# Patient Record
Sex: Female | Born: 1980
Health system: Southern US, Community
[De-identification: ages and names within clinical notes are randomized; demographics above are authoritative.]

## PROBLEM LIST (undated history)

## (undated) DIAGNOSIS — K219 Gastro-esophageal reflux disease without esophagitis: Secondary | ICD-10-CM

## (undated) DIAGNOSIS — Z9049 Acquired absence of other specified parts of digestive tract: Secondary | ICD-10-CM

## (undated) DIAGNOSIS — E039 Hypothyroidism, unspecified: Secondary | ICD-10-CM

## (undated) DIAGNOSIS — D649 Anemia, unspecified: Secondary | ICD-10-CM

## (undated) DIAGNOSIS — N926 Irregular menstruation, unspecified: Secondary | ICD-10-CM

## (undated) DIAGNOSIS — R002 Palpitations: Secondary | ICD-10-CM

## (undated) HISTORY — DX: Gastro-esophageal reflux disease without esophagitis: K21.9

## (undated) HISTORY — PX: MYRINGOTOMY WITH TUBE PLACEMENT: SHX5663

## (undated) HISTORY — DX: Hypothyroidism, unspecified: E03.9

## (undated) HISTORY — DX: Acquired absence of other specified parts of digestive tract: Z90.49

## (undated) HISTORY — DX: Irregular menstruation, unspecified: N92.6

## (undated) HISTORY — PX: WISDOM TOOTH EXTRACTION: SHX21

## (undated) HISTORY — DX: Palpitations: R00.2

## (undated) HISTORY — PX: CHOLECYSTECTOMY: SHX55

---

## 2000-05-23 ENCOUNTER — Inpatient Hospital Stay (HOSPITAL_COMMUNITY): Admission: AD | Admit: 2000-05-23 | Discharge: 2000-05-23 | Payer: Self-pay | Admitting: *Deleted

## 2000-05-23 ENCOUNTER — Encounter: Payer: Self-pay | Admitting: *Deleted

## 2000-08-14 ENCOUNTER — Encounter: Payer: Self-pay | Admitting: Obstetrics and Gynecology

## 2000-08-14 ENCOUNTER — Ambulatory Visit (HOSPITAL_COMMUNITY): Admission: RE | Admit: 2000-08-14 | Discharge: 2000-08-14 | Payer: Self-pay | Admitting: Obstetrics and Gynecology

## 2000-10-04 ENCOUNTER — Inpatient Hospital Stay (HOSPITAL_COMMUNITY): Admission: AD | Admit: 2000-10-04 | Discharge: 2000-10-04 | Payer: Self-pay | Admitting: Obstetrics and Gynecology

## 2000-10-22 ENCOUNTER — Inpatient Hospital Stay (HOSPITAL_COMMUNITY): Admission: AD | Admit: 2000-10-22 | Discharge: 2000-10-22 | Payer: Self-pay | Admitting: Obstetrics and Gynecology

## 2000-10-23 ENCOUNTER — Inpatient Hospital Stay (HOSPITAL_COMMUNITY): Admission: AD | Admit: 2000-10-23 | Discharge: 2000-10-23 | Payer: Self-pay | Admitting: *Deleted

## 2000-11-19 ENCOUNTER — Encounter: Payer: Self-pay | Admitting: Obstetrics and Gynecology

## 2000-11-19 ENCOUNTER — Inpatient Hospital Stay (HOSPITAL_COMMUNITY): Admission: AD | Admit: 2000-11-19 | Discharge: 2000-11-19 | Payer: Self-pay | Admitting: Obstetrics and Gynecology

## 2000-11-20 ENCOUNTER — Inpatient Hospital Stay (HOSPITAL_COMMUNITY): Admission: AD | Admit: 2000-11-20 | Discharge: 2000-11-20 | Payer: Self-pay | Admitting: Obstetrics and Gynecology

## 2000-12-02 ENCOUNTER — Inpatient Hospital Stay (HOSPITAL_COMMUNITY): Admission: AD | Admit: 2000-12-02 | Discharge: 2000-12-04 | Payer: Self-pay | Admitting: Obstetrics and Gynecology

## 2001-01-08 ENCOUNTER — Other Ambulatory Visit: Admission: RE | Admit: 2001-01-08 | Discharge: 2001-01-08 | Payer: Self-pay | Admitting: Obstetrics and Gynecology

## 2002-02-04 ENCOUNTER — Other Ambulatory Visit: Admission: RE | Admit: 2002-02-04 | Discharge: 2002-02-04 | Payer: Self-pay | Admitting: Obstetrics and Gynecology

## 2002-10-08 ENCOUNTER — Other Ambulatory Visit: Admission: RE | Admit: 2002-10-08 | Discharge: 2002-10-08 | Payer: Self-pay | Admitting: Obstetrics and Gynecology

## 2003-03-03 ENCOUNTER — Inpatient Hospital Stay (HOSPITAL_COMMUNITY): Admission: AD | Admit: 2003-03-03 | Discharge: 2003-03-03 | Payer: Self-pay | Admitting: Obstetrics and Gynecology

## 2003-03-25 ENCOUNTER — Inpatient Hospital Stay (HOSPITAL_COMMUNITY): Admission: AD | Admit: 2003-03-25 | Discharge: 2003-03-25 | Payer: Self-pay | Admitting: *Deleted

## 2003-03-26 ENCOUNTER — Inpatient Hospital Stay (HOSPITAL_COMMUNITY): Admission: AD | Admit: 2003-03-26 | Discharge: 2003-03-26 | Payer: Self-pay | Admitting: *Deleted

## 2003-04-17 ENCOUNTER — Inpatient Hospital Stay (HOSPITAL_COMMUNITY): Admission: AD | Admit: 2003-04-17 | Discharge: 2003-04-17 | Payer: Self-pay | Admitting: Obstetrics and Gynecology

## 2003-04-24 ENCOUNTER — Inpatient Hospital Stay (HOSPITAL_COMMUNITY): Admission: AD | Admit: 2003-04-24 | Discharge: 2003-04-24 | Payer: Self-pay | Admitting: Obstetrics & Gynecology

## 2003-04-29 ENCOUNTER — Inpatient Hospital Stay (HOSPITAL_COMMUNITY): Admission: AD | Admit: 2003-04-29 | Discharge: 2003-04-30 | Payer: Self-pay | Admitting: Obstetrics and Gynecology

## 2003-06-09 ENCOUNTER — Other Ambulatory Visit: Admission: RE | Admit: 2003-06-09 | Discharge: 2003-06-09 | Payer: Self-pay | Admitting: Obstetrics and Gynecology

## 2003-10-06 ENCOUNTER — Emergency Department (HOSPITAL_COMMUNITY): Admission: AD | Admit: 2003-10-06 | Discharge: 2003-10-06 | Payer: Self-pay | Admitting: Family Medicine

## 2004-03-14 ENCOUNTER — Emergency Department (HOSPITAL_COMMUNITY): Admission: EM | Admit: 2004-03-14 | Discharge: 2004-03-14 | Payer: Self-pay | Admitting: Emergency Medicine

## 2004-04-12 ENCOUNTER — Other Ambulatory Visit: Admission: RE | Admit: 2004-04-12 | Discharge: 2004-04-12 | Payer: Self-pay | Admitting: Obstetrics and Gynecology

## 2004-10-14 ENCOUNTER — Emergency Department (HOSPITAL_COMMUNITY): Admission: EM | Admit: 2004-10-14 | Discharge: 2004-10-15 | Payer: Self-pay | Admitting: Emergency Medicine

## 2005-02-04 ENCOUNTER — Encounter: Admission: RE | Admit: 2005-02-04 | Discharge: 2005-03-20 | Payer: Self-pay | Admitting: Family Medicine

## 2005-06-21 ENCOUNTER — Emergency Department (HOSPITAL_COMMUNITY): Admission: EM | Admit: 2005-06-21 | Discharge: 2005-06-21 | Payer: Self-pay | Admitting: Emergency Medicine

## 2005-07-12 ENCOUNTER — Emergency Department (HOSPITAL_COMMUNITY): Admission: EM | Admit: 2005-07-12 | Discharge: 2005-07-13 | Payer: Self-pay | Admitting: *Deleted

## 2005-12-16 ENCOUNTER — Inpatient Hospital Stay (HOSPITAL_COMMUNITY): Admission: AD | Admit: 2005-12-16 | Discharge: 2005-12-16 | Payer: Self-pay | Admitting: Obstetrics and Gynecology

## 2006-02-14 ENCOUNTER — Ambulatory Visit (HOSPITAL_COMMUNITY): Admission: RE | Admit: 2006-02-14 | Discharge: 2006-02-14 | Payer: Self-pay | Admitting: Obstetrics and Gynecology

## 2006-04-05 ENCOUNTER — Inpatient Hospital Stay (HOSPITAL_COMMUNITY): Admission: AD | Admit: 2006-04-05 | Discharge: 2006-04-05 | Payer: Self-pay | Admitting: Obstetrics and Gynecology

## 2006-04-30 ENCOUNTER — Inpatient Hospital Stay (HOSPITAL_COMMUNITY): Admission: AD | Admit: 2006-04-30 | Discharge: 2006-04-30 | Payer: Self-pay | Admitting: Obstetrics and Gynecology

## 2006-05-01 ENCOUNTER — Inpatient Hospital Stay (HOSPITAL_COMMUNITY): Admission: AD | Admit: 2006-05-01 | Discharge: 2006-05-01 | Payer: Self-pay | Admitting: Obstetrics and Gynecology

## 2006-05-02 ENCOUNTER — Inpatient Hospital Stay (HOSPITAL_COMMUNITY): Admission: AD | Admit: 2006-05-02 | Discharge: 2006-05-02 | Payer: Self-pay | Admitting: *Deleted

## 2006-06-18 ENCOUNTER — Inpatient Hospital Stay (HOSPITAL_COMMUNITY): Admission: AD | Admit: 2006-06-18 | Discharge: 2006-06-19 | Payer: Self-pay | Admitting: *Deleted

## 2006-06-23 ENCOUNTER — Inpatient Hospital Stay (HOSPITAL_COMMUNITY): Admission: RE | Admit: 2006-06-23 | Discharge: 2006-06-25 | Payer: Self-pay | Admitting: Obstetrics and Gynecology

## 2006-10-08 ENCOUNTER — Emergency Department (HOSPITAL_COMMUNITY): Admission: EM | Admit: 2006-10-08 | Discharge: 2006-10-08 | Payer: Self-pay | Admitting: Emergency Medicine

## 2007-06-28 ENCOUNTER — Inpatient Hospital Stay (HOSPITAL_COMMUNITY): Admission: AD | Admit: 2007-06-28 | Discharge: 2007-06-28 | Payer: Self-pay | Admitting: *Deleted

## 2007-06-29 ENCOUNTER — Inpatient Hospital Stay (HOSPITAL_COMMUNITY): Admission: AD | Admit: 2007-06-29 | Discharge: 2007-06-30 | Payer: Self-pay | Admitting: Obstetrics & Gynecology

## 2007-09-14 ENCOUNTER — Inpatient Hospital Stay (HOSPITAL_COMMUNITY): Admission: AD | Admit: 2007-09-14 | Discharge: 2007-09-14 | Payer: Self-pay | Admitting: Obstetrics

## 2009-09-12 ENCOUNTER — Emergency Department (HOSPITAL_COMMUNITY): Admission: EM | Admit: 2009-09-12 | Discharge: 2009-09-12 | Payer: Self-pay | Admitting: Emergency Medicine

## 2009-10-12 ENCOUNTER — Ambulatory Visit (HOSPITAL_COMMUNITY): Admission: RE | Admit: 2009-10-12 | Discharge: 2009-10-12 | Payer: Self-pay | Admitting: Family Medicine

## 2009-12-27 ENCOUNTER — Emergency Department (HOSPITAL_COMMUNITY): Admission: EM | Admit: 2009-12-27 | Discharge: 2009-12-27 | Payer: Self-pay | Admitting: Emergency Medicine

## 2010-03-16 ENCOUNTER — Emergency Department (HOSPITAL_COMMUNITY): Admission: EM | Admit: 2010-03-16 | Discharge: 2010-03-16 | Payer: Self-pay | Admitting: Emergency Medicine

## 2010-05-01 ENCOUNTER — Ambulatory Visit (HOSPITAL_COMMUNITY): Admission: RE | Admit: 2010-05-01 | Discharge: 2010-05-01 | Payer: Self-pay | Admitting: Unknown Physician Specialty

## 2010-06-21 ENCOUNTER — Ambulatory Visit (HOSPITAL_COMMUNITY): Admission: RE | Admit: 2010-06-21 | Discharge: 2010-06-21 | Payer: Self-pay | Admitting: Family Medicine

## 2010-07-29 ENCOUNTER — Emergency Department (HOSPITAL_COMMUNITY): Admission: EM | Admit: 2010-07-29 | Discharge: 2010-07-29 | Payer: Self-pay | Admitting: Emergency Medicine

## 2011-01-20 ENCOUNTER — Encounter: Payer: Self-pay | Admitting: Unknown Physician Specialty

## 2011-03-16 LAB — BASIC METABOLIC PANEL
BUN: 6 mg/dL (ref 6–23)
CO2: 23 mEq/L (ref 19–32)
Chloride: 107 mEq/L (ref 96–112)
GFR calc non Af Amer: >60 mL/min (ref >60)
Glucose, Bld: 111 mg/dL — ABNORMAL HIGH (ref 70–99)
Potassium: 3.6 mEq/L (ref 3.5–5.1)

## 2011-03-16 LAB — CBC
HCT: 33.6 % — ABNORMAL LOW (ref 36.0–46.0)
MCH: 25.6 pg — ABNORMAL LOW (ref 26.0–34.0)
MCHC: 32.9 g/dL (ref 30.0–36.0)
MCV: 77.9 fL — ABNORMAL LOW (ref 78.0–100.0)
RDW: 14.4 % (ref 11.5–15.5)

## 2011-03-16 LAB — URINALYSIS, ROUTINE W REFLEX MICROSCOPIC
Glucose, UA: NEGATIVE mg/dL
Leukocytes, UA: NEGATIVE
Nitrite: NEGATIVE
Protein, ur: NEGATIVE mg/dL

## 2011-03-16 LAB — DIFFERENTIAL
Basophils Absolute: 0 10*3/uL (ref 0.0–0.1)
Basophils Relative: 0 % (ref 0–1)
Eosinophils Absolute: 0 10*3/uL (ref 0.0–0.7)
Eosinophils Relative: 1 % (ref 0–5)
Monocytes Absolute: 0.4 10*3/uL (ref 0.1–1.0)
Monocytes Relative: 7 % (ref 3–12)

## 2011-03-16 LAB — URINE MICROSCOPIC-ADD ON

## 2011-03-25 LAB — DIFFERENTIAL
Basophils Absolute: 0 10*3/uL (ref 0.0–0.1)
Eosinophils Relative: 1 % (ref 0–5)
Lymphocytes Relative: 23 % (ref 12–46)
Monocytes Absolute: 0.2 10*3/uL (ref 0.1–1.0)

## 2011-03-25 LAB — BASIC METABOLIC PANEL
CO2: 26 mEq/L (ref 19–32)
Chloride: 105 mEq/L (ref 96–112)
GFR calc non Af Amer: >60 mL/min (ref >60)
Glucose, Bld: 101 mg/dL — ABNORMAL HIGH (ref 70–99)
Potassium: 4.1 mEq/L (ref 3.5–5.1)
Sodium: 139 mEq/L (ref 135–145)

## 2011-03-25 LAB — CBC
HCT: 35.9 % — ABNORMAL LOW (ref 36.0–46.0)
Hemoglobin: 12.1 g/dL (ref 12.0–15.0)
MCHC: 33.8 g/dL (ref 30.0–36.0)
RDW: 15 % (ref 11.5–15.5)

## 2011-03-25 LAB — URINALYSIS, ROUTINE W REFLEX MICROSCOPIC
Glucose, UA: NEGATIVE mg/dL
Leukocytes, UA: NEGATIVE
Nitrite: NEGATIVE
Protein, ur: NEGATIVE mg/dL
pH: 7 (ref 5.0–8.0)

## 2011-03-25 LAB — URINE MICROSCOPIC-ADD ON

## 2011-03-25 LAB — PREGNANCY, URINE: Preg Test, Ur: NEGATIVE

## 2011-05-14 NOTE — H&P (Signed)
NAME:  Kristina Lambert, Kristina Lambert                ACCOUNT NO.:  [id number]   MEDICAL RECORD NO.:  [id number]          PATIENT TYPE:  MAT   LOCATION:  [facility]                          FACILITY:  [facility]   PHYSICIAN:  Lendon Colonel, MD   DATE OF BIRTH:  1981-04-06   DATE OF ADMISSION:  09/14/2007  DATE OF DISCHARGE:                              HISTORY & PHYSICAL   HISTORY OF THE PRESENT ILLNESS:  This is a 30 year old G4, P3, 0, 1, 3  with a history of an spontaneous abortion times one who comes in with  two weeks of left lower quadrant cramps, some rectal pressure, and some  dizziness.  The patient is attempting pregnancy.  The patient does note  an spontaneous abortion, as noted above, about 2.5 months ago.  She is a  patient of Dr. Billy Coast.  She reports no prior ectopic.  No history of  sexually transmitted infections; and, she is monogamous with one  partner.  Her LMP was August 05, 2007.  She states that her pain comes  and goes a couple times a day; and, at the most the pain lasts for 15-20  minutes.  During the times that she has pain she has some nausea,  otherwise she has no nausea, no vomiting, no diarrhea, no constipation,  and no abnormal vaginal discharge.   PAST MEDICAL HISTORY:  The patient's past medical history is significant  for:  1. Preeclampsia during her first pregnancy.  2. Preterm deliveries.  3. The patient's medical history is otherwise unremarkable.   MEDICATIONS:  The patient is on no medicines.   ALLERGIES:  The patient has no allergies.   PAST SURGICAL HISTORY:  The patient has had no prior surgeries.   PHYSICAL EXAMINATION:  VITAL SIGNS:  On physical exam her temperature is  97.3.  Her blood pressure is 143/73.  Her pulse is unlabored.  Respirations are 20.  HEART:  The heart has a regular rate and rhythm.  LUNGS:  The lugs are clear to auscultation bilaterally.  ABDOMEN:  The abdomen is soft and nontender with no masses, no rebound,  no guarding, and no rigidity  GENITALIA:  The genitourinary exam shows normal external genitalia.  Normal vagina.  No cervix.  No cervical motion tenderness.  Normal  discharge.  No adnexal masses or tenderness   LABORATORY DATA:  The patient had a urine pregnancy test, which was  negative followed  by a serum pregnancy test that was also negative.  Wet prep was negative.   ASSESSMENT AND PLAN:  This is a 30 year old gravida 4, para 3, 0, 1, 3  with delayed menstrual cycle and intermittent left lower quadrant pain  for the past to weeks.  Given that she had negative serum and urine  pregnancy tests there is no concern for an ectopic.  Today her abdominal  pain is of unclear etiology.  The patient was told to  follow up immediately for increased in pain or any temperature,  otherwise she should follow up in the Outpatient Clinic in one to four  weeks for possible ultrasound reevaluation.  If  she still has pain she  was recommended to use Motrin and Tylenol as needed or the pain.      Lendon Colonel, MD  Electronically Signed     KAF/MEDQ  D:  09/14/2007  T:  09/15/2007  Job:  [id number]

## 2011-05-17 NOTE — Consult Note (Signed)
   NAME:  Kristina Lambert, Kristina Lambert                          ACCOUNT NO.:  [id number]   MEDICAL RECORD NO.:  [id number]                   PATIENT TYPE:  MAT   LOCATION:  [facility]                                 [facility]ILITY:  [facility]   PHYSICIAN:  Lenoard Aden, M.D.             DATE OF BIRTH:  1981/02/06   DATE OF CONSULTATION:  03/03/2003  DATE OF DISCHARGE:                                   CONSULTATION   EMERGENCY ROOM CONSULTATION   CHIEF COMPLAINT:  Rule out preterm labor.   HISTORY OF PRESENT ILLNESS:  The patient is a 30 year old white female G2  P1, EDD May 16, 2003 at 29-and-a-half weeks gestation who presents with  increased frequency of contractions.   ALLERGIES:  PENICILLIN.   MEDICATIONS:  Prenatal vitamins.   PAST HISTORY:  History of spontaneous vaginal delivery in December 2001.  No  other medical or surgical hospitalizations except for childhood surgery.   FAMILY HISTORY:  Heart disease, anemia, insulin-dependent diabetes, and  thyroid dysfunction.   PRENATAL LABORATORY DATA:  Blood type A positive, Rh antibody negative.  Rubella immune.  Hepatitis B surface antigen negative.  HIV nonreactive.  GC  and chlamydia negative.   PHYSICAL EXAMINATION:  GENERAL:  She is a comfortable-appearing white  female, no apparent distress.  VITAL SIGNS:  Stable, afebrile.  HEENT:  Normal.  LUNGS:  Clear.  HEART:  Regular rhythm.  ABDOMEN:  Soft, gravid, and nontender.  PELVIC:  Cervical exam is 1 cm, thick, presenting part out of the pelvis.  EXTREMITIES:  No cords.  NEUROLOGIC:  Nonfocal.   IMPRESSION:  1. Twenty-nine week intrauterine pregnancy.  2. Rule out preterm labor.   PLAN:  1. Terbutaline 0.25 subcu; repeat as needed.  2. Procardia 10 mg p.o. q.4-6h. p.r.n. contractions.  3.     Discharge to home.  4. Follow up in one week to check a fetal fibronectin.  5. Labor warnings given.                                               Lenoard Aden, M.D.    RJT/MEDQ  D:   03/03/2003  T:  03/04/2003  Job:  [id number]

## 2011-05-17 NOTE — H&P (Signed)
NAMELANAYA, Kristina Lambert                ACCOUNT NO.:  [id number]   MEDICAL RECORD NO.:  [id number]          PATIENT TYPE:  INP   LOCATION:  9132                          [facility]ILITY:  [facility]   PHYSICIAN:  Lenoard Aden, M.D.DATE OF BIRTH:  1981/08/19   DATE OF ADMISSION:  06/23/2006  DATE OF DISCHARGE:                                HISTORY & PHYSICAL   CHIEF COMPLAINT:  Prodromal labor.  She is a 30 year old white female, G3,  P2, who presented at 37-1/2 gestation with increased frequency of  contractions over the last 24 hours, and cervical change.  She is a  nonsmoker, nondrinker, denies __________   NO KNOWN DRUG ALLERGIES.   History of vaginal delivery times two, myringotomy and migraine headaches.   MEDICATIONS:  Prenatal vitamins, Zofran as needed.   FAMILY HISTORY:  Diabetes, thyroid disease, heart disease, seizure disorder,  skin cancer and depression.   PHYSICAL EXAMINATION:  She is a well-developed, well-nourished white female  in no acute distress.  HEENT:  Normal.  LUNGS:  Clear.  HEART:  Regular rate and rhythm.  ABDOMEN:  Soft, gravid and nontender.  Estimated fetal weight is 7 pounds.  Cervix is 4 cm, 70%, vertex, -1.  EXTREMITIES:  No cords.  NEUROLOGIC:  Exam is nonfocal.  SKIN:  Intact.   IMPRESSION:  A 37 week intrauterine pregnancy in prodromal labor with  cervical change noted.   PLAN:  Anticipated attempts at vaginal delivery, augmentation as needed.      Lenoard Aden, M.D.  Electronically Signed     RJT/MEDQ  D:  06/23/2006  T:  06/23/2006  Job:  [id number]

## 2011-05-17 NOTE — Consult Note (Signed)
Kristina Lambert, Kristina Lambert                ACCOUNT NO.:  [id number]   MEDICAL RECORD NO.:  [id number]          PATIENT TYPE:  MAT   LOCATION:  [facility]                          [facility]   PHYSICIAN:  Lenoard Aden, M.D.DATE OF BIRTH:  Nov 01, 1981   DATE OF CONSULTATION:  12/16/2005  DATE OF DISCHARGE:                                   CONSULTATION   CHIEF COMPLAINT:  Nausea and vomiting.   HISTORY OF PRESENT ILLNESS:  She is a 30 year old white female, gravida 3,  para 2, who presents at [redacted] weeks gestation with refractory nausea and  vomiting to Zofran and Phenergan.   ALLERGIES:  PENICILLIN.   MEDICATIONS:  Prenatal vitamins, Zofran and Phenergan.   OB HISTORY:  She has history of two vaginal deliveries.  No other medical or  surgical hospitalizations.   FAMILY HISTORY:  Heart disease, anemia, insulin-dependent diabetes, thyroid  dysfunction.   PRENATAL LABS:  Blood type is A positive.  Fetal heart tones previously  documented.   PHYSICAL EXAMINATION:  GENERAL:  This is a comfortable-appearing white  female in no acute distress.  HEENT: Normal.  LUNGS:  Clear.  HEART:  Regular rhythm.  ABDOMEN:  Soft, nontender.  PELVIC:  A 10-week size uterus.  No adnexal masses.  Fetal heart tones  heard.  EXTREMITIES:  No cords.  NEUROLOGIC:  Nonfocal.   IMPRESSION:  1.  A 10-week intrauterine pregnancy.  2.  Hyperemesis of pregnancy.   PLAN:  Proceed with IV fluid hydration and anti-emetics.  Discharge upon  tolerating p.o. without difficulty.      Lenoard Aden, M.D.  Electronically Signed     RJT/MEDQ  D:  12/16/2005  T:  12/17/2005  Job:  [id number]

## 2011-05-17 NOTE — H&P (Signed)
   NAME:  Kristina Lambert, PESNELL                          ACCOUNT NO.:  [id number]   MEDICAL RECORD NO.:  [id number]                   PATIENT TYPE:  INP   LOCATION:  9312                                 [facility]ILITY:  [facility]   PHYSICIAN:  Lenoard Aden, M.D.             DATE OF BIRTH:  12/23/1981   DATE OF ADMISSION:  04/29/2003  DATE OF DISCHARGE:                                HISTORY & PHYSICAL   CHIEF COMPLAINT:  Labor.   HISTORY OF PRESENT ILLNESS:  The patient is a 30 year old white female G2,  P7, EDD May 16, 2003 at 37+ weeks who presents in active labor.   ALLERGIES:  PENICILLIN.   MEDICATIONS:  Prenatal vitamins.   PAST OBSTETRICAL HISTORY:  History of 6 pound 11 ounce female in 2001.   PAST MEDICAL HISTORY:  No other medical or surgical hospitalizations outside  of myringotomy and eyelid surgery.   FAMILY HISTORY:  Thyroid disease, skin cancer, seizure disorder, anemia,  hypertension.   PRENATAL LABORATORIES:  Blood type A+.  Rubella immune.  Hepatitis B surface  negative.  HIV nonreactive.   Pregnancy complicated by preterm cervical change.   PHYSICAL EXAMINATION:  GENERAL:  She is a comfortable appearing white female  in moderate amount of distress.  HEENT:  Normal.  LUNGS:  Clear.  HEART:  Regular rate and rhythm.  ABDOMEN:  Soft, gravid, nontender.  PELVIC:  Cervix is 5-6 cm, 70%, vertex, and 0.  EXTREMITIES:  No cords.  NEUROLOGIC:  Nonfocal.   IMPRESSION:  Term intrauterine pregnancy in active labor.   PLAN:  Anticipate attempts at vaginal delivery.  Epidural p.r.n.                                               Lenoard Aden, M.D.    RJT/MEDQ  D:  04/29/2003  T:  04/29/2003  Job:  [id number]

## 2011-05-17 NOTE — Consult Note (Signed)
Kristina Lambert, Kristina Lambert                ACCOUNT NO.:  [id number]   MEDICAL RECORD NO.:  [id number]          PATIENT TYPE:  MAT   LOCATION:  [facility]                          [facility]   PHYSICIAN:  Lenoard Aden, M.D.DATE OF BIRTH:  06/06/1981   DATE OF CONSULTATION:  06/18/2006  DATE OF DISCHARGE:  06/19/2006                                   CONSULTATION   CHIEF COMPLAINT:  Spontaneous rupture of membranes.   The patient is currently at [redacted] week gestation, is a G3, P2 with questionable  leakage of fluid this evening.  She denies any contractions or vaginal  bleeding.  She reports good fetal movement.   MEDICATIONS:  Include prenatal vitamins.   ALLERGIES:  SHE HAS NO KNOWN DRUG ALLERGIES.   FAMILY HISTORY:  Noncontributory.   SOCIAL HISTORY:  Noncontributory.   OBSTETRICAL HISTORY:  Remarkable for 2 uncomplicated vaginal deliveries both  at 36 weeks.   PHYSICAL EXAMINATION:  VITAL SIGNS:  Stable.  She is afebrile.  HEENT:  Normal.  LUNGS:  Clear.  HEART:  Regular rhythm.  ABDOMEN:  Soft, gravid, nontender.  PELVIC:  Speculum exam performed reveals no evidence of ferning or  Nitrazine, no fluid extravasation, cervix is 1-2 cm, 50% vertex, -2.  EXTREMITIES:  No edema.  NEUROLOGIC:  Nonfocal.   IMPRESSION:  A 36-week intrauterine pregnancy, no evidence of spontaneous  rupture of membranes, reactive NST noted.  No active contractions noted.  We  will discharge home.  Bleeding precautions and labor warnings given.      Lenoard Aden, M.D.  Electronically Signed     RJT/MEDQ  D:  06/19/2006  T:  06/19/2006  Job:  [id number]

## 2011-05-17 NOTE — Consult Note (Signed)
NAME:  Kristina Lambert, STROHM NO.:  [id number]   MEDICAL RECORD NO.:  [id number]          PATIENT TYPE:  MAT   LOCATION:  [facility]                          [facility]   PHYSICIAN:  Lenoard Aden, M.D.DATE OF BIRTH:  07/13/1981   DATE OF CONSULTATION:  DATE OF DISCHARGE:                                   CONSULTATION   CHIEF COMPLAINT:  Preterm labor.   She is a 30 year old white female, G3, P2, at [redacted] weeks gestation, who  presents with contractions and preterm cervical change.  She has a  noncontributory social history.  She has a history of two spontaneous term  vaginal deliveries.   Past medical history is remarkable for migraine headaches and pregnancy-  induced hypertension.   She is a nonsmoker and nondrinker.  Denies __________.   Medications are prenatal vitamins and iron.   FAMILY HISTORY:  Remarkable for hypertension, anemia, thyroid disease,  seizure disorder, depression, and skin cancer.   PHYSICAL EXAMINATION:  GENERAL:  She is a well-developed and well-nourished  white female in no acute distress.  HEENT:  Normal.  LUNGS:  Clear.  HEART:  Regular rhythm.  ABDOMEN:  Soft, gravid, nontender.  PELVIC:  Cervix is flared, long.  Fingertip.  Vertex -2.  EXTREMITIES:  No cords.  NEUROLOGIC:  Nonfocal.   IMPRESSION:  1.  A 30-week intrauterine pregnancy.  2.  Preterm cervical change with reactive nonstress test and irregular      contractions.   PLAN:  Terbutaline subcu, betamethasone, repeat betamethasone in 24 hours.  Fetal fibrin nectin in 24 hours.  Procardia 10 mg p.o. q.4-6h. p.r.n.  Probable discharge home.      Lenoard Aden, M.D.  Electronically Signed    RJT/MEDQ  D:  04/30/2006  T:  04/30/2006  Job:  [id number]

## 2011-05-17 NOTE — Consult Note (Signed)
Kristina Lambert, Kristina Lambert                ACCOUNT NO.:  [id number]   MEDICAL RECORD NO.:  [id number]          PATIENT TYPE:  MAT   LOCATION:  [facility]                          [facility]   PHYSICIAN:  Lenoard Aden, M.D.DATE OF BIRTH:  [birth year]   DATE OF CONSULTATION:  04/05/2006  DATE OF DISCHARGE:                                   CONSULTATION   CHIEF COMPLAINT:  Cramping.   She is a 30 year old white female, G3, P2, history of term delivery times  two with a history of intermittent cramping throughout this pregnancy which  has been accompanied by no evidence of cervical change. She has a past  pregnancy history that is remarkable by two uncomplicated vaginal  deliveries. She is a nonsmoker, nondrinker, and deniesuse of drugs.   PHYSICAL EXAMINATION:  GENERAL: She is a well-developed, well-nourished  white female in no acute distress.  HEENT: Normal.  LUNGS: Clear.  HEART: Rate rhythm.  ABDOMEN: Soft, gravid, and nontender.  GU: Cervix is flared, closed, long, and presenting part is ballottable.  EXTREMITIES/NEUROLOGIC: Nonfocal.   NST is reactive. No contractions noted.   IMPRESSION:  1.  A 26-week OB.  2.  No evidence of preterm cervical change.   PLAN:  Discharge home.  Follow up in the office in 48 hours if no  improvement. Preterm labor warnings given.      Lenoard Aden, M.D.  Electronically Signed     RJT/MEDQ  D:  04/05/2006  T:  04/05/2006  Job:  [id number]

## 2011-05-17 NOTE — H&P (Signed)
[facility]  Patient:    Kristina Lambert, Kristina Lambert                       MRN: [id number] Adm. Date:  [id number] Attending:  Lenoard Aden                         History and Physical  CHIEF COMPLAINT:              Worsening preeclampsia.  HISTORY OF PRESENT ILLNESS:   This is a 30 year old white female, G1, P0, EDD December 26, 2000, at 36+ weeks who presents with blood pressure 140/100 and 1600 mg in a 24 hour urine.  She reports occasional headaches, denies epigastric pain.  PAST MEDICAL HISTORY:         Noncontributory.  Patient has had myringotomy tubes as a child, but otherwise no hospitalizations.  FAMILY HISTORY:               Noncontributory.  PREGNANCY HISTORY:            Remarkable for blood type of A positive, Rh antibody negative, toxoplasmosis negative, VDRL nonreactive, Rubella immune, Hepatitis B surface antigen negative, HIV nonreactive.  Pregnancy complicated by preterm cervical change and mild preeclampsia.  PHYSICAL EXAMINATION:  GENERAL:                      She is a well-developed, well-nourished white female in no apparent distress.  VITAL SIGNS:                  Blood pressure 140/100.  HEENT:                        Normal.  LUNGS:                        Clear.  HEART:                        Regular rate and rhythm.  ABDOMEN:                      Soft, gravid, nontender.  Estimated fetal weight 6-1/2 pounds.  Cervix 4 cm, 70% vertex, minus 1.  EXTREMITIES:                  There are no cords.  DTRs 3+, no events of clonus noted.  Upper quadrant tenderness.  NEUROLOGIC:                   Nonfocal.  IMPRESSION:                   1. A 36+ week intrauterine pregnancy.                               2. Exacerbation of mild preeclampsia.  PLAN:                         Proceed with induction and delivery.  Will consider magnesium sulfate pending intrapartum blood pressures and post partum course.  Check CBC, _________ on  admission. DD:  12/02/00 TD:  12/02/00 Job: [id number]

## 2011-06-18 ENCOUNTER — Other Ambulatory Visit: Payer: Self-pay | Admitting: Family Medicine

## 2011-06-18 ENCOUNTER — Ambulatory Visit (HOSPITAL_COMMUNITY)
Admission: RE | Admit: 2011-06-18 | Discharge: 2011-06-18 | Disposition: A | Discharge status: Home / Self Care | Payer: Federal, State, Local not specified - PPO | Source: Ambulatory Visit | Attending: Family Medicine | Admitting: Family Medicine

## 2011-06-18 DIAGNOSIS — R1031 Right lower quadrant pain: Secondary | ICD-10-CM | POA: Insufficient documentation

## 2011-06-18 DIAGNOSIS — R1011 Right upper quadrant pain: Secondary | ICD-10-CM | POA: Insufficient documentation

## 2011-06-18 DIAGNOSIS — K8020 Calculus of gallbladder without cholecystitis without obstruction: Secondary | ICD-10-CM | POA: Insufficient documentation

## 2011-06-18 MED ORDER — IOHEXOL 300 MG/ML  SOLN
100.0000 mL | Freq: Once | INTRAMUSCULAR | Status: AC | PRN
  Administered 2011-06-18: 100 mL via INTRAVENOUS

## 2011-10-10 LAB — URINALYSIS, ROUTINE W REFLEX MICROSCOPIC
Glucose, UA: NEGATIVE
Ketones, ur: NEGATIVE
pH: 6

## 2011-10-10 LAB — WET PREP, GENITAL: Yeast Wet Prep HPF POC: NONE SEEN

## 2011-10-10 LAB — HCG, SERUM, QUALITATIVE: Preg, Serum: NEGATIVE

## 2011-10-10 LAB — POCT PREGNANCY, URINE
Operator id: 287941
Preg Test, Ur: NEGATIVE

## 2011-10-16 LAB — CBC
HCT: 39.1
Hemoglobin: 12.9
MCHC: 32.9
MCV: 82.9
RBC: 4.72

## 2012-03-20 ENCOUNTER — Inpatient Hospital Stay (HOSPITAL_COMMUNITY)
Admission: AD | Admit: 2012-03-20 | Discharge: 2012-03-20 | Disposition: A | Discharge status: Home / Self Care | Payer: Federal, State, Local not specified - PPO | Source: Ambulatory Visit | Attending: Obstetrics and Gynecology | Admitting: Obstetrics and Gynecology

## 2012-03-20 ENCOUNTER — Encounter (HOSPITAL_COMMUNITY): Payer: Self-pay

## 2012-03-20 DIAGNOSIS — N920 Excessive and frequent menstruation with regular cycle: Secondary | ICD-10-CM | POA: Insufficient documentation

## 2012-03-20 HISTORY — DX: Anemia, unspecified: D64.9

## 2012-03-20 LAB — URINE MICROSCOPIC-ADD ON

## 2012-03-20 LAB — POCT PREGNANCY, URINE: Preg Test, Ur: NEGATIVE

## 2012-03-20 LAB — CBC
HCT: 37.9 % (ref 36.0–46.0)
Hemoglobin: 12 g/dL (ref 12.0–15.0)
RDW: 15.5 % (ref 11.5–15.5)
WBC: 7.8 10*3/uL (ref 4.0–10.5)

## 2012-03-20 LAB — URINALYSIS, ROUTINE W REFLEX MICROSCOPIC
Glucose, UA: NEGATIVE mg/dL
Specific Gravity, Urine: 1.015 (ref 1.005–1.030)

## 2012-03-20 NOTE — MAU Note (Signed)
Soaking a pad every hour to 1 1/2, LMP 03/20/12, has history of heavy periods.

## 2012-03-20 NOTE — MAU Provider Note (Signed)
  History   Menorrhagia  CSN: [id number]  Arrival date and time: 03/20/12 1824   None     Chief Complaint  Patient presents with  . Vaginal Bleeding   HPI  OB History    Grav Para Term Preterm Abortions TAB SAB Ect Mult Living   5 3 1 2 2  0 2 0 0 3      Past Medical History  Diagnosis Date  . Anemia     Past Surgical History  Procedure Date  . No past surgeries     Family History  Problem Relation Age of Onset  . Anesthesia problems Neg Hx     History  Substance Use Topics  . Smoking status: Never Smoker   . Smokeless tobacco: Not on file  . Alcohol Use:     Allergies:  Allergies  Allergen Reactions  . Carrot Flavor Rash  . Nexium Rash  . Sulfa Antibiotics Rash    Prescriptions prior to admission  Medication Sig Dispense Refill  . ferrous sulfate 325 (65 FE) MG tablet Take 325 mg by mouth daily.      Marland Kitchen ibuprofen (ADVIL,MOTRIN) 200 MG tablet Take 400 mg by mouth every 6 (six) hours as needed. Cramps/pain      . pantoprazole (PROTONIX) 20 MG tablet Take 20 mg by mouth daily.      . SUMAtriptan (IMITREX) 50 MG tablet Take 50 mg by mouth every 2 (two) hours as needed. Migraine HA        ROS Physical Exam   Blood pressure 132/84, pulse 104, temperature 97.4 F (36.3 C), temperature source Oral, resp. rate 16, height 5\' 6"  (1.676 m), weight 100.154 kg (220 lb 12.8 oz), last menstrual period 03/20/2012.  Physical Exam dictated  MAU Course  Procedures  MDM na  Assessment and Plan  Menorrhagia with nl CBC and neg spt DC home  Raygen Linquist J 03/20/2012, 7:58 PM

## 2012-03-20 NOTE — Discharge Instructions (Signed)
Abnormal Uterine Bleeding Abnormal uterine bleeding can have many causes. Some cases are simply treated, while others are more serious. There are several kinds of bleeding that is considered abnormal, including:  Bleeding between periods.   Bleeding after sexual intercourse.   Spotting anytime in the menstrual cycle.   Bleeding heavier or more than normal.   Bleeding after menopause.  CAUSES  There are many causes of abnormal uterine bleeding. It can be present in teenagers, pregnant women, women during their reproductive years, and women who have reached menopause. Your caregiver will look for the more common causes depending on your age, signs, symptoms and your particular circumstance. Most cases are not serious and can be treated. Even the more serious causes, like cancer of the female organs, can be treated adequately if found in the early stages. That is why all types of bleeding should be evaluated and treated as soon as possible. DIAGNOSIS  Diagnosing the cause may take several kinds of tests. Your caregiver may:  Take a complete history of the type of bleeding.   Perform a complete physical exam and Pap smear.   Take an ultrasound on the abdomen showing a picture of the female organs and the pelvis.   Inject dye into the uterus and Fallopian tubes and X-ray them (hysterosalpingogram).   Place fluid in the uterus and do an ultrasound (sonohysterogrqphy).   Take a CT scan to examine the female organs and pelvis.   Take an MRI to examine the female organs and pelvis. There is no X-ray involved with this procedure.   Look inside the uterus with a telescope that has a light at the end (hysteroscopy).   Scrap the inside of the uterus to get tissue to examine (Dilatation and Curettage, D&C).   Look into the pelvis with a telescope that has a light at the end (laparoscopy). This is done through a very small cut (incision) in the abdomen.  TREATMENT  Treatment will depend on the  cause of the abnormal bleeding. It can include:  Doing nothing to allow the problem to take care of itself over time.   Hormone treatment.   Birth control pills.   Treating the medical condition causing the problem.   Laparoscopy.   Major or minor surgery   Destroying the lining of the uterus with electrical currant, laser, freezing or heat (uterine ablation).  HOME CARE INSTRUCTIONS   Follow your caregiver's recommendation on how to treat your problem.   See your caregiver if you missed a menstrual period and think you may be pregnant.   If you are bleeding heavily, count the number of pads/tampons you use and how often you have to change them. Tell this to your caregiver.   Avoid sexual intercourse until the problem is controlled.  SEEK MEDICAL CARE IF:   You have any kind of abnormal bleeding mentioned above.   You feel dizzy at times.   You are 31 years old and have not had a menstrual period yet.  SEEK IMMEDIATE MEDICAL CARE IF:   You pass out.   You are changing pads/tampons every 15 to 30 minutes.   You have belly (abdominal) pain.   You have a temperature of 100 F (37.8 C) or higher.   You become sweaty or weak.   You are passing large blood clots from the vagina.   You start to feel sick to your stomach (nauseous) and throw up (vomit).  Document Released: 12/16/2005 Document Revised: 12/05/2011 Document Reviewed: 05/11/2009 [employer]   Patient Information 2012 [employer]. 

## 2012-03-21 NOTE — Consult Note (Signed)
Kristina Lambert, ERTLE NO.:  [id number]  MEDICAL RECORD NO.:  [id number]  LOCATION:  ZO10                          FACILITY:  [facility]  PHYSICIAN:  Lenoard Aden, M.D.DATE OF BIRTH:  08-Oct-1981  DATE OF CONSULTATION: DATE OF DISCHARGE:  03/20/2012                                CONSULTATION   CHIEF COMPLAINT:  Menorrhagia.  HISTORY OF PRESENT ILLNESS:  She is a 31 year old white female G4, P3 who presents with a heavy cycle today.  She called the office, was started on Motrin and Lysteda but now apparently presents to the emergency room because of a heavy period.  MEDICATIONS:  She has medications to include Lysteda and Naprosyn .  PAST MEDICAL HISTORY:  Gastroesophageal reflux disease, history of 3 vaginal deliveries as well as one spontaneous abortion.  ALLERGIES:  To sulfa, Nexium, and penicillin.  SOCIAL HISTORY:  She is a nonsmoker, nondrinker.  Denies domestic or physical violence.  FAMILY HISTORY:  Diabetes, MI, depression, seizure disorder, thyroid disease, chronic hypertension, and congenital anomalies.  PHYSICAL EXAMINATION:  GENERAL:  She is a well-developed, well- nourished, white female, in no acute distress. VITAL SIGNS:  Stable, afebrile. HEENT:  Normal. NECK:  Supple.  Full range of motion. LUNGS:  Clear. HEART:  Regular rhythm. ABDOMEN:  Soft and nontender. PELVIC:  Reveals a normal size uterus.  No adnexal masses. EXTREMITIES:  No cords. NEUROLOGIC:  Nonfocal. SKIN:  Intact.  LABORATORY DATA:  UPT is negative.  CBC is normal with a hemoglobin of 12.  IMPRESSION:  Menorrhagia with normal hemoglobin, negative __________.  PLAN:  To take medication as previously directed and discharge home.     Lenoard Aden, M.D.     RJT/MEDQ  D:  03/20/2012  T:  03/21/2012  Job:  [id number]

## 2013-08-20 ENCOUNTER — Encounter: Payer: Self-pay | Admitting: Family Medicine

## 2013-08-20 ENCOUNTER — Telehealth: Payer: Self-pay | Admitting: Family Medicine

## 2013-08-20 ENCOUNTER — Other Ambulatory Visit: Payer: Self-pay | Admitting: *Deleted

## 2013-08-20 ENCOUNTER — Ambulatory Visit (INDEPENDENT_AMBULATORY_CARE_PROVIDER_SITE_OTHER): Payer: BC Managed Care – PPO | Admitting: Family Medicine

## 2013-08-20 VITALS — BP 120/78 | Temp 97.6°F | Ht 66.0 in | Wt 191.6 lb

## 2013-08-20 DIAGNOSIS — K219 Gastro-esophageal reflux disease without esophagitis: Secondary | ICD-10-CM

## 2013-08-20 DIAGNOSIS — J329 Chronic sinusitis, unspecified: Secondary | ICD-10-CM

## 2013-08-20 MED ORDER — PANTOPRAZOLE SODIUM 20 MG PO TBEC: 20.0000 mg | DELAYED_RELEASE_TABLET | Freq: Every day | ORAL | Status: DC

## 2013-08-20 MED ORDER — AZITHROMYCIN 250 MG PO TABS: ORAL_TABLET | ORAL | Status: DC

## 2013-08-20 MED ORDER — PANTOPRAZOLE SODIUM 40 MG PO TBEC: 40.0000 mg | DELAYED_RELEASE_TABLET | Freq: Every day | ORAL | Status: DC

## 2013-08-20 NOTE — Telephone Encounter (Signed)
Patient calling to find out why her Protonix Rx was lowered from 40 mg to 20 mg?

## 2013-08-20 NOTE — Telephone Encounter (Signed)
It was listed in the computer as protonix 20/ change to forty

## 2013-08-20 NOTE — Progress Notes (Signed)
  Subjective:    Patient ID: Kristina Lambert, female    DOB: August 05, 1981, 32 y.o.   MRN: [id number]  HPI Comments: Also needs a refill on Protonix   No other concerns  Sinusitis This is a new problem. The current episode started in the past 7 days. The problem has been gradually worsening since onset. Associated symptoms include congestion, coughing, ear pain, headaches and sinus pressure. Past treatments include nothing.  have had a cold, a lot of pressure in face  Also ear ain  Some drainage, worse at night,  Left ear pain  Some morn sore throat    Review of Systems  HENT: Positive for ear pain, congestion and sinus pressure.   Respiratory: Positive for cough.   Neurological: Positive for headaches.       Objective:   Physical Exam  Alert mild malaise. HEENT mild nasal congestion. Tympanic membranes old scarring noted. Slight retraction on left. Maxillary tenderness slight erythema pharynx neck supple lungs clear heart regular rate and rhythm.      Assessment & Plan:  Impression rhinosinusitis #2 reflux. Plan protonic refilled. Z-Pak at patient's request that antibiotic works better for her. Afrin when necessary. WSL

## 2013-08-20 NOTE — Telephone Encounter (Signed)
protonix 40 mg sent to pharm. Pt notified

## 2013-09-15 ENCOUNTER — Encounter: Payer: Self-pay | Admitting: Nurse Practitioner

## 2013-09-15 ENCOUNTER — Ambulatory Visit (INDEPENDENT_AMBULATORY_CARE_PROVIDER_SITE_OTHER): Payer: BC Managed Care – PPO | Admitting: Nurse Practitioner

## 2013-09-15 VITALS — BP 122/82 | Temp 99.1°F | Ht 66.0 in | Wt 191.4 lb

## 2013-09-15 DIAGNOSIS — R634 Abnormal weight loss: Secondary | ICD-10-CM

## 2013-09-15 DIAGNOSIS — G43109 Migraine with aura, not intractable, without status migrainosus: Secondary | ICD-10-CM | POA: Insufficient documentation

## 2013-09-15 DIAGNOSIS — Z0000 Encounter for general adult medical examination without abnormal findings: Secondary | ICD-10-CM

## 2013-09-15 DIAGNOSIS — J309 Allergic rhinitis, unspecified: Secondary | ICD-10-CM

## 2013-09-15 DIAGNOSIS — J300 Vasomotor rhinitis: Secondary | ICD-10-CM

## 2013-09-15 DIAGNOSIS — D509 Iron deficiency anemia, unspecified: Secondary | ICD-10-CM

## 2013-09-15 DIAGNOSIS — R5381 Other malaise: Secondary | ICD-10-CM

## 2013-09-15 DIAGNOSIS — M2669 Other specified disorders of temporomandibular joint: Secondary | ICD-10-CM

## 2013-09-15 MED ORDER — AZITHROMYCIN 250 MG PO TABS: ORAL_TABLET | ORAL | Status: DC

## 2013-09-15 MED ORDER — SUMATRIPTAN SUCCINATE 50 MG PO TABS: ORAL_TABLET | ORAL | Status: DC

## 2013-09-15 NOTE — Patient Instructions (Signed)
OTC antihistamine as directed Nasacort AQ as directed 

## 2013-09-15 NOTE — Assessment & Plan Note (Signed)
Continue Imitrex as directed. Avoid triggers as much as possible. Patient defers daily medication at this time.

## 2013-09-15 NOTE — Progress Notes (Signed)
Subjective:  Presents for recheck on her migraines. Has a history of migraine with aura. Usually relieved with Imitrex. Has mostly "nagging" headaches. Her last true migraine headache was 2 weeks ago. Was treated for sinus infection on 8/22 with a Z-Pak. Continues to have sinus pressure slightly worse on the left side. No fever. No cough. No sore throat or ear pain. Has also developed some pain that occurs on either side of the face usually starting in the TMJ area. Does have popping with opening and closing of the mouth. Has talked to her dentist, she does not have any signs of bruxism. Also concerned because she has lost 18 pounds unintentionally over the past 4 months. Occasional mild palpitations and flushed feeling usually in the evenings. Has identified several triggers for her migraines including [place] food and [employer]. Usually better with ibuprofen and a nap which brings it to a tolerable level.  Objective:   BP 122/82  Temp(Src) 99.1 F (37.3 C)  Ht 5\' 6"  (1.676 m)  Wt 191 lb 6.4 oz (86.818 kg)  BMI 30.91 kg/m2 NAD. Alert, oriented. TMs clear effusion, no erythema. Significant sclerotic changes noted on the left TM. Pharynx mildly injected with slightly green PND noted. Neck supple with mild soft nontender adenopathy. Lungs clear. Heart regular rate rhythm. Tenderness noted in the TMJ area bilaterally with significant popping noted with opening and closing of the mouth more so on the left side.  Assessment:Migraine with aura  Vasomotor rhinitis  TMJ inflammation  Routine general medical examination at a health care facility - Plan: Basic metabolic panel, Hepatic function panel, Lipid panel, T4, free, TSH  Anemia, iron deficiency - Plan: CBC with Differential  Other malaise and fatigue - Plan: T4, free, TSH  Unexplained weight loss - Plan: T4, free, TSH  Plan: Meds ordered this encounter  Medications  . azithromycin (ZITHROMAX Z-PAK) 250 MG tablet    Sig: Take 2 tablets  (500 mg) on  Day 1,  followed by 1 tablet (250 mg) once daily on Days 2 through 5.    Dispense:  6 each    Refill:  0    Order Specific Question:  Supervising Provider    Answer:  Merlyn Albert [[id number]]  . SUMAtriptan (IMITREX) 50 MG tablet    Sig: One po at onset of migraine; may repeat once in 2 hrs if needed; max 2 per 24 hrs.    Dispense:  10 tablet    Refill:  5    Order Specific Question:  Supervising Provider    Answer:  Riccardo Dubin   Ice/heat applications to TMJ area. Avoid certain foods requiring intense chewing. Discussed importance of stress reduction. Will repeat Z-Pak since patient has several sensitivities to antibiotics. OTC meds as directed for head congestion. Recheck if symptoms worsen or persist, otherwise further followup based on lab report.

## 2013-09-16 LAB — CBC WITH DIFFERENTIAL/PLATELET
Basophils Absolute: 0 10*3/uL (ref 0.0–0.1)
HCT: 32.7 % — ABNORMAL LOW (ref 36.0–46.0)
Hemoglobin: 10.1 g/dL — ABNORMAL LOW (ref 12.0–15.0)
Lymphocytes Relative: 33 % (ref 12–46)
Lymphs Abs: 1.6 10*3/uL (ref 0.7–4.0)
Monocytes Absolute: 0.3 10*3/uL (ref 0.1–1.0)
Monocytes Relative: 7 % (ref 3–12)
Neutro Abs: 2.8 10*3/uL (ref 1.7–7.7)
RBC: 4.54 MIL/uL (ref 3.87–5.11)
WBC: 4.9 10*3/uL (ref 4.0–10.5)

## 2013-09-16 LAB — BASIC METABOLIC PANEL
Calcium: 8.9 mg/dL (ref 8.4–10.5)
Sodium: 139 mEq/L (ref 135–145)

## 2013-09-16 LAB — HEPATIC FUNCTION PANEL
ALT: 8 U/L (ref 0–35)
AST: 12 U/L (ref 0–37)
Bilirubin, Direct: 0.1 mg/dL (ref 0.0–0.3)
Indirect Bilirubin: 0.4 mg/dL (ref 0.0–0.9)

## 2013-09-16 LAB — LIPID PANEL
LDL Cholesterol: 86 mg/dL (ref 0–99)
Triglycerides: 65 mg/dL (ref <150)
VLDL: 13 mg/dL (ref 0–40)

## 2013-09-27 ENCOUNTER — Telehealth: Payer: Self-pay | Admitting: Family Medicine

## 2013-09-27 NOTE — Telephone Encounter (Signed)
Results discussed with patient-see lab results 

## 2013-09-27 NOTE — Telephone Encounter (Signed)
Patient wants results of labs from last week.

## 2013-09-27 NOTE — Telephone Encounter (Signed)
See result note for labs.

## 2013-09-27 NOTE — Telephone Encounter (Signed)
Patient would like bloodwork paperwork

## 2014-03-23 ENCOUNTER — Other Ambulatory Visit: Payer: Self-pay | Admitting: Family Medicine

## 2014-10-31 ENCOUNTER — Encounter: Payer: Self-pay | Admitting: Nurse Practitioner

## 2014-12-30 HISTORY — PX: GALLBLADDER SURGERY: SHX652

## 2018-08-10 ENCOUNTER — Encounter: Payer: Self-pay | Admitting: Family Medicine

## 2018-08-10 ENCOUNTER — Ambulatory Visit: Payer: Federal, State, Local not specified - PPO | Admitting: Family Medicine

## 2018-08-10 VITALS — BP 122/76 | Ht 66.0 in | Wt 204.0 lb

## 2018-08-10 DIAGNOSIS — E039 Hypothyroidism, unspecified: Secondary | ICD-10-CM

## 2018-08-10 DIAGNOSIS — R1011 Right upper quadrant pain: Secondary | ICD-10-CM

## 2018-08-10 DIAGNOSIS — N926 Irregular menstruation, unspecified: Secondary | ICD-10-CM | POA: Insufficient documentation

## 2018-08-10 DIAGNOSIS — K2100 Gastro-esophageal reflux disease with esophagitis, without bleeding: Secondary | ICD-10-CM

## 2018-08-10 DIAGNOSIS — Z79899 Other long term (current) drug therapy: Secondary | ICD-10-CM

## 2018-08-10 HISTORY — DX: Irregular menstruation, unspecified: N92.6

## 2018-08-10 HISTORY — DX: Hypothyroidism, unspecified: E03.9

## 2018-08-10 MED ORDER — PANTOPRAZOLE SODIUM 40 MG PO TBEC: 40.0000 mg | DELAYED_RELEASE_TABLET | Freq: Every day | ORAL | 5 refills | Status: DC

## 2018-08-10 NOTE — Progress Notes (Signed)
   Subjective:    Patient ID: Kristina Lambert, female    DOB: [birth year], 37 y.o.   MRN: [id number]  Gastroesophageal Reflux  She complains of globus sensation, heartburn and nausea. She reports no abdominal pain, no chest pain, no choking, no coughing, no dysphagia, no early satiety, no hoarse voice, no sore throat or no stridor. This is a recurrent problem. The current episode started 1 to 4 weeks ago. The problem occurs frequently. The problem has been gradually worsening. The heartburn duration is more than one hour. The heartburn is located in the substernum, RUQ and abdomen. The heartburn is of moderate intensity. The heartburn wakes her from sleep. The heartburn does not limit her activity. The heartburn doesn't change with position. The symptoms are aggravated by stress. Pertinent negatives include no fatigue. Treatments tried: tums. The treatment provided mild relief.  Pt had gallbladder removed 3 years ago and states these are some of the same symptoms.  (Pt states she is also allergic to flu shot)   Review of Systems  Constitutional: Negative for activity change, appetite change and fatigue.  HENT: Negative for congestion, hoarse voice, rhinorrhea and sore throat.   Respiratory: Negative for cough, choking and shortness of breath.   Cardiovascular: Negative for chest pain and leg swelling.  Gastrointestinal: Positive for heartburn and nausea. Negative for abdominal pain, diarrhea and dysphagia.  Endocrine: Negative for polydipsia and polyphagia.  Skin: Negative for color change.  Neurological: Negative for dizziness and weakness.  Psychiatric/Behavioral: Negative for behavioral problems and confusion.       Objective:   Physical Exam  Constitutional: She appears well-nourished. No distress.  HENT:  Head: Normocephalic and atraumatic.  Eyes: Right eye exhibits no discharge. Left eye exhibits no discharge.  Neck: No tracheal deviation present.  Cardiovascular: Normal rate,  regular rhythm and normal heart sounds.  No murmur heard. Pulmonary/Chest: Effort normal and breath sounds normal. No respiratory distress.  Musculoskeletal: She exhibits no edema.  Lymphadenopathy:    She has no cervical adenopathy.  Neurological: She is alert. Coordination normal.  Skin: Skin is warm and dry.  Psychiatric: She has a normal mood and affect. Her behavior is normal.  Vitals reviewed.         Assessment & Plan:  Thyroid condition-followed by her integrated wellness doctor I clearly discussed with the patient that we would not have management over this or her cycles she will see her integrative doctor as well as gynecologist for these issues  She does relate upper abdominal pain and reflux issues has had gallbladder removed in the past we will check lab work if it looks good she will go forward with PPI Protonix prescribed she will notify us if not covered  If ongoing troubles then may need ultrasound and EGD patient will let us know if not improving over the next few weeks

## 2018-08-11 LAB — CBC WITH DIFFERENTIAL/PLATELET
Basophils Absolute: 0 x10E3/uL (ref 0.0–0.2)
Basos: 0 %
EOS (ABSOLUTE): 0.1 x10E3/uL (ref 0.0–0.4)
Eos: 1 %
Hematocrit: 42 % (ref 34.0–46.6)
Hemoglobin: 13.8 g/dL (ref 11.1–15.9)
Immature Grans (Abs): 0 x10E3/uL (ref 0.0–0.1)
Immature Granulocytes: 0 %
Lymphocytes Absolute: 1.4 x10E3/uL (ref 0.7–3.1)
Lymphs: 20 %
MCH: 29 pg (ref 26.6–33.0)
MCHC: 32.9 g/dL (ref 31.5–35.7)
MCV: 88 fL (ref 79–97)
Monocytes Absolute: 0.4 x10E3/uL (ref 0.1–0.9)
Monocytes: 6 %
Neutrophils Absolute: 5 x10E3/uL (ref 1.4–7.0)
Neutrophils: 73 %
Platelets: 235 x10E3/uL (ref 150–450)
RBC: 4.76 x10E6/uL (ref 3.77–5.28)
RDW: 13.2 % (ref 12.3–15.4)
WBC: 6.9 x10E3/uL (ref 3.4–10.8)

## 2018-08-11 LAB — BASIC METABOLIC PANEL
BUN/Creatinine Ratio: 12 (ref 9–23)
BUN: 8 mg/dL (ref 6–20)
CALCIUM: 9.2 mg/dL (ref 8.7–10.2)
CO2: 24 mmol/L (ref 20–29)
Chloride: 103 mmol/L (ref 96–106)
Creatinine, Ser: 0.68 mg/dL (ref 0.57–1.00)
GFR, EST AFRICAN AMERICAN: 128 mL/min/{1.73_m2} (ref >59)
GFR, EST NON AFRICAN AMERICAN: 111 mL/min/{1.73_m2} (ref >59)
Glucose: 101 mg/dL — ABNORMAL HIGH (ref 65–99)
POTASSIUM: 3.9 mmol/L (ref 3.5–5.2)
Sodium: 140 mmol/L (ref 134–144)

## 2018-08-11 LAB — HEPATIC FUNCTION PANEL
ALT: 7 IU/L (ref 0–32)
AST: 14 IU/L (ref 0–40)
Albumin: 4.3 g/dL (ref 3.5–5.5)
Alkaline Phosphatase: 54 IU/L (ref 39–117)
Bilirubin Total: 0.4 mg/dL (ref 0.0–1.2)
Bilirubin, Direct: 0.13 mg/dL (ref 0.00–0.40)
Total Protein: 6.9 g/dL (ref 6.0–8.5)

## 2018-08-11 LAB — LIPASE: Lipase: 26 U/L (ref 14–72)

## 2018-12-11 ENCOUNTER — Other Ambulatory Visit (HOSPITAL_COMMUNITY)
Admission: RE | Admit: 2018-12-11 | Discharge: 2018-12-11 | Disposition: A | Discharge status: Home / Self Care | Payer: Federal, State, Local not specified - PPO | Source: Ambulatory Visit | Attending: Family Medicine | Admitting: Family Medicine

## 2018-12-11 ENCOUNTER — Ambulatory Visit: Payer: Federal, State, Local not specified - PPO | Admitting: Family Medicine

## 2018-12-11 ENCOUNTER — Telehealth: Payer: Self-pay | Admitting: Family Medicine

## 2018-12-11 ENCOUNTER — Encounter: Payer: Self-pay | Admitting: Family Medicine

## 2018-12-11 VITALS — BP 118/80 | Temp 98.5°F | Ht 66.0 in | Wt 204.0 lb

## 2018-12-11 DIAGNOSIS — R1011 Right upper quadrant pain: Secondary | ICD-10-CM

## 2018-12-11 DIAGNOSIS — R1013 Epigastric pain: Secondary | ICD-10-CM | POA: Diagnosis not present

## 2018-12-11 DIAGNOSIS — Z9049 Acquired absence of other specified parts of digestive tract: Secondary | ICD-10-CM

## 2018-12-11 HISTORY — DX: Acquired absence of other specified parts of digestive tract: Z90.49

## 2018-12-11 LAB — BASIC METABOLIC PANEL
Anion gap: 7 (ref 5–15)
BUN: 11 mg/dL (ref 6–20)
CO2: 24 mmol/L (ref 22–32)
CREATININE: 0.65 mg/dL (ref 0.44–1.00)
Calcium: 9 mg/dL (ref 8.9–10.3)
Chloride: 108 mmol/L (ref 98–111)
GFR calc Af Amer: >60 mL/min (ref >60)
GFR calc non Af Amer: >60 mL/min (ref >60)
GLUCOSE: 115 mg/dL — AB (ref 70–99)
Potassium: 3.6 mmol/L (ref 3.5–5.1)
Sodium: 139 mmol/L (ref 135–145)

## 2018-12-11 LAB — CBC WITH DIFFERENTIAL/PLATELET
Abs Immature Granulocytes: 0.01 10*3/uL (ref 0.00–0.07)
BASOS PCT: 0 %
Basophils Absolute: 0 10*3/uL (ref 0.0–0.1)
EOS ABS: 0.1 10*3/uL (ref 0.0–0.5)
EOS PCT: 1 %
HCT: 42.3 % (ref 36.0–46.0)
Hemoglobin: 13.5 g/dL (ref 12.0–15.0)
Immature Granulocytes: 0 %
Lymphocytes Relative: 21 %
Lymphs Abs: 1.1 10*3/uL (ref 0.7–4.0)
MCH: 28.5 pg (ref 26.0–34.0)
MCHC: 31.9 g/dL (ref 30.0–36.0)
MCV: 89.2 fL (ref 80.0–100.0)
MONO ABS: 0.4 10*3/uL (ref 0.1–1.0)
MONOS PCT: 8 %
NEUTROS ABS: 3.6 10*3/uL (ref 1.7–7.7)
Neutrophils Relative %: 70 %
PLATELETS: 242 10*3/uL (ref 150–400)
RBC: 4.74 MIL/uL (ref 3.87–5.11)
RDW: 12.5 % (ref 11.5–15.5)
WBC: 5.2 10*3/uL (ref 4.0–10.5)
nRBC: 0 % (ref 0.0–0.2)

## 2018-12-11 LAB — HEPATIC FUNCTION PANEL
ALT: 9 U/L (ref 0–44)
AST: 14 U/L — ABNORMAL LOW (ref 15–41)
Albumin: 4.1 g/dL (ref 3.5–5.0)
Alkaline Phosphatase: 43 U/L (ref 38–126)
BILIRUBIN TOTAL: 0.4 mg/dL (ref 0.3–1.2)
Bilirubin, Direct: <0.1 mg/dL (ref 0.0–0.2)
Total Protein: 7.3 g/dL (ref 6.5–8.1)

## 2018-12-11 LAB — LIPASE, BLOOD: Lipase: 37 U/L (ref 11–51)

## 2018-12-11 MED ORDER — SUCRALFATE 1 G PO TABS: 1.0000 g | ORAL_TABLET | Freq: Three times a day (TID) | ORAL | 0 refills | Status: DC

## 2018-12-11 NOTE — Telephone Encounter (Signed)
See results note please 

## 2018-12-11 NOTE — Telephone Encounter (Signed)
Pt would like to know results of her stat blood work that was completed today.

## 2018-12-11 NOTE — Progress Notes (Signed)
   Subjective:    Patient ID: Kristina Lambert, female    DOB: 07/03/1981, 37 y.o.   MRN: [id number]  HPI Patient is here today with complaints of acid reflux,and abdominal pain that started four days ago. She says her abdominal pain is directly under both ribs,mostly on the right side.She says the pain is a burning sensation. No burning sensation in the middle of chest. She does have a history of reflux and is taking Protonix 40 mg once per am,she has been taking this for the last two- three months ago,but reports she does not always take as she should.She reports that she does eat tomato products and caffeine.She also has her head raise on a stack of pillows at night,but bed is not elevated at the top. She describes as a sharp pain underneath the rib cage worse on the right side than the left side She had her gallbladder removed 3 years ago She denies any major issues currently She denies rectal bleeding hematuria denies vomiting denies fever chills She does have a history of reflux but states this does not feel like reflux Review of Systems  Constitutional: Negative for activity change, appetite change and fatigue.  HENT: Negative for congestion and rhinorrhea.   Respiratory: Negative for cough and shortness of breath.   Cardiovascular: Negative for chest pain and leg swelling.  Gastrointestinal: Positive for abdominal pain. Negative for diarrhea.  Endocrine: Negative for polydipsia and polyphagia.  Skin: Negative for color change.  Neurological: Negative for dizziness and weakness.  Psychiatric/Behavioral: Negative for behavioral problems and confusion.       Objective:   Physical Exam Vitals signs reviewed.  Constitutional:      General: She is not in acute distress. HENT:     Head: Normocephalic and atraumatic.  Eyes:     General:        Right eye: No discharge.        Left eye: No discharge.  Neck:     Trachea: No tracheal deviation.  Cardiovascular:     Rate and Rhythm:  Normal rate and regular rhythm.     Heart sounds: Normal heart sounds. No murmur.  Pulmonary:     Effort: Pulmonary effort is normal. No respiratory distress.     Breath sounds: Normal breath sounds.  Abdominal:     Palpations: Abdomen is soft.     Tenderness: There is abdominal tenderness.  Lymphadenopathy:     Cervical: No cervical adenopathy.  Skin:    General: Skin is warm and dry.  Neurological:     Mental Status: She is alert.     Coordination: Coordination normal.  Psychiatric:        Behavior: Behavior normal.    Tenderness in the epigastric region right upper quadrant region       Assessment & Plan:  Epigastric abdominal pain right upper quadrant pain Had gallbladder removed 3 years ago Stat labs ordered Ultrasound ordered Continue PPI Further work-up and treatment based upon results

## 2018-12-17 ENCOUNTER — Ambulatory Visit (HOSPITAL_COMMUNITY)
Admission: RE | Admit: 2018-12-17 | Discharge: 2018-12-17 | Disposition: A | Discharge status: Home / Self Care | Payer: Federal, State, Local not specified - PPO | Source: Ambulatory Visit | Attending: Family Medicine | Admitting: Family Medicine

## 2018-12-17 DIAGNOSIS — R1013 Epigastric pain: Secondary | ICD-10-CM | POA: Insufficient documentation

## 2018-12-17 DIAGNOSIS — Z9049 Acquired absence of other specified parts of digestive tract: Secondary | ICD-10-CM | POA: Diagnosis not present

## 2018-12-17 DIAGNOSIS — R1011 Right upper quadrant pain: Secondary | ICD-10-CM

## 2018-12-21 ENCOUNTER — Other Ambulatory Visit: Payer: Self-pay | Admitting: *Deleted

## 2018-12-21 DIAGNOSIS — R109 Unspecified abdominal pain: Secondary | ICD-10-CM

## 2018-12-28 ENCOUNTER — Encounter: Payer: Self-pay | Admitting: Family Medicine

## 2019-01-08 ENCOUNTER — Encounter: Payer: Self-pay | Admitting: Gastroenterology

## 2019-01-12 DIAGNOSIS — Z6832 Body mass index (BMI) 32.0-32.9, adult: Secondary | ICD-10-CM | POA: Diagnosis not present

## 2019-01-12 DIAGNOSIS — Z01419 Encounter for gynecological examination (general) (routine) without abnormal findings: Secondary | ICD-10-CM | POA: Diagnosis not present

## 2019-01-18 ENCOUNTER — Encounter: Payer: Self-pay | Admitting: Gastroenterology

## 2019-01-18 ENCOUNTER — Ambulatory Visit: Payer: Federal, State, Local not specified - PPO | Admitting: Gastroenterology

## 2019-01-18 VITALS — BP 122/82 | HR 101 | Ht 66.5 in | Wt 209.1 lb

## 2019-01-18 DIAGNOSIS — R1011 Right upper quadrant pain: Secondary | ICD-10-CM

## 2019-01-18 NOTE — Patient Instructions (Signed)
If you are age 39 or older, your body mass index should be between 23-30. Your Body mass index is 33.25 kg/m. If this is out of the aforementioned range listed, please consider follow up with your Primary Care Provider.  If you are age 51 or younger, your body mass index should be between 19-25. Your Body mass index is 33.25 kg/m. If this is out of the aformentioned range listed, please consider follow up with your Primary Care Provider.   You have been scheduled for an endoscopy. Please follow written instructions given to you at your visit today. If you use inhalers (even only as needed), please bring them with you on the day of your procedure. Your physician has requested that you go to www.startemmi.com and enter the access code given to you at your visit today. This web site gives a general overview about your procedure. However, you should still follow specific instructions given to you by our office regarding your preparation for the procedure.  Thank you,  Dr. Lynann Bologna

## 2019-01-18 NOTE — Progress Notes (Signed)
Chief Complaint: Right upper quadrant abdominal pain  Referring Provider:  Babs SciaraLuking, Scott A, MD      ASSESSMENT AND PLAN;   #1.  RUQ pain. Neg US 11/2018, Nl CBC, CMP and lipase 11/2018, s/p cholecystectomy 2016 d/t gallstones. Epigastric pain.  D/d PUD, spasms involving the hepatic flexure, GERD, gastritis, nonulcer dyspepsia, gastroparesis, musculoskeletal etiology, doubt biliary or pancreatic problems.  #2. GERD  Plan: - Protonix 40mg  po qd to continue - EGD with SB Bx. Discussed risks and benefits. - If still with problems, trial of levsin sublingual as needed - No indication for colon at this time. - Monitor weight.  Encouraged her to gradually reduce weight. - If still with problems and the above work-up is negative, proceed with CT scan abdo/pelvis.  HPI:    Kristina Lambert is a 38 y.o. female  RUQ x 11/2018-intermittent, annoying, nonradiating, sharp at times More like burning sensation, with occasional waxing and waning course No nausea, no vomiting Some abdominal bloating Pain occasionally gets relieved by defecation Carafate did help somewhat. Had negative ultrasound of the abdomen.  Had normal labs. " Does at times sound like gallbladder without the gallbladder" She has been on Protonix 40 mg p.o. once a day for the last 2 years.  Controls her reflux.  She has longstanding history of heartburn.  She had heartburn during all the 3 pregnancies.  No odynophagia or dysphagia.  Has gained 10 pounds over the last 3 to 4 months.  The only change in her medications is from [facility] Thyroid to nature thyroid in October 2019.  No diarrhea.  She has normal bowels after cholecystectomy.  Used to be more constipated with passage of pellet-like stools.  No nonsteroidals.  No sodas chocolates or mints.  History of anemia in the past due to heavy menstrual cycles.  Currently taking iron supplements.  Has been on iron supplements over last several years.  Most recent hemoglobin was  normal as below.  Iron or any other medications do not bother her stomach.  3 kids - 18, 15, 12.  Older one is the boy, younger daughters. Past Medical History:  Diagnosis Date  . Anemia   . History of cholecystectomy 12/11/2018   [facility]  . Hypothyroidism 08/10/2018   Manages thyroid through [facility]- Dallie PilesKelly Carpenter NP  . Irregular periods/menstrual cycles 08/10/2018   Managed by [facility] Also sees Dr Rosemary Holmsavon on once a year basis    Past Surgical History:  Procedure Laterality Date  . GALLBLADDER SURGERY  2016  . MYRINGOTOMY WITH TUBE PLACEMENT     as a kid    Family History  Problem Relation Age of Onset  . Diabetes Mother        doesn't take medication. Type 2   . Heart disease Father   . Anesthesia problems Neg Hx   . Colon cancer Neg Hx   . Esophageal cancer Neg Hx     Social History   Tobacco Use  . Smoking status: Never Smoker  . Smokeless tobacco: Never Used  Substance Use Topics  . Alcohol use: Not Currently  . Drug use: No    Current Outpatient Medications  Medication Sig Dispense Refill  . Ascorbic Acid (VITAMIN C) 1000 MG tablet Take 1,000 mg by mouth daily.    . Cholecalciferol (VITAMIN D3 PO) Take by mouth.    . co-enzyme Q-10 30 MG capsule Take 100 mg by mouth 3 (three) times daily.    . cyanocobalamin  1000 MCG tablet Take 1,000 mcg by mouth daily.    . Ferrous Fumarate (IRON) 18 MG TBCR Take by mouth.    Marland Kitchen ibuprofen (ADVIL,MOTRIN) 200 MG tablet Take 400 mg by mouth every 6 (six) hours as needed. Cramps/pain    . NATURE-THROID 32.5 MG tablet     . pantoprazole (PROTONIX) 40 MG tablet Take 1 tablet (40 mg total) by mouth daily. 30 tablet 5  . Progesterone Micronized (PROGESTERONE PO) Take 75 mg by mouth every other day.    . vitamin k 100 MCG tablet Take 100 mcg by mouth daily.     No current facility-administered medications for this visit.     Allergies  Allergen Reactions  . Influenza Vaccines     Arm  swelled and hives  . Penicillins   . Carrot Flavor Rash  . Esomeprazole Magnesium Rash  . Sulfa Antibiotics Rash    Review of Systems:  Constitutional: Denies fever, chills, diaphoresis, appetite change and fatigue.  HEENT: Denies photophobia, eye pain, redness, hearing loss, ear pain, congestion, sore throat, rhinorrhea, sneezing, mouth sores, neck pain, neck stiffness and tinnitus.   Respiratory: Denies SOB, DOE, cough, chest tightness,  and wheezing.   Cardiovascular: Denies chest pain, palpitations and leg swelling.  Genitourinary: Denies dysuria, urgency, frequency, hematuria, flank pain and difficulty urinating.  Musculoskeletal: Denies myalgias, back pain, joint swelling, arthralgias and gait problem.  Skin: No rash.  Neurological: Denies dizziness, seizures, syncope, weakness, light-headedness, numbness and headaches.  Hematological: Denies adenopathy. Easy bruising, personal or family bleeding history  Psychiatric/Behavioral: No anxiety or depression     Physical Exam:    BP 122/82   Pulse (!) 101   Ht 5' 6.5" (1.689 m)   Wt 209 lb 2 oz (94.9 kg)   BMI 33.25 kg/m  Filed Weights   01/18/19 1004  Weight: 209 lb 2 oz (94.9 kg)   Constitutional:  Well-developed, in no acute distress. Psychiatric: Normal mood and affect. Behavior is normal. HEENT: Pupils normal.  Conjunctivae are normal. No scleral icterus. Neck supple.  Cardiovascular: Normal rate, regular rhythm. No edema Pulmonary/chest: Effort normal and breath sounds normal. No wheezing, rales or rhonchi. Abdominal: Soft, nondistended. Nontender. Bowel sounds active throughout. There are no masses palpable. No hepatomegaly. Rectal:  defered Neurological: Alert and oriented to person place and time. Skin: Skin is warm and dry. No rashes noted.  Data Reviewed: I have personally reviewed following labs and imaging studies  CBC: CBC Latest Ref Rng & Units 12/11/2018 08/10/2018 09/16/2013  WBC 4.0 - 10.5 K/uL 5.2 6.9  4.9  Hemoglobin 12.0 - 15.0 g/dL 00.8 67.6 10.1(L)  Hematocrit 36.0 - 46.0 % 42.3 42.0 32.7(L)  Platelets 150 - 400 K/uL 242 235 254    CMP: CMP Latest Ref Rng & Units 12/11/2018 08/10/2018 09/16/2013  Glucose 70 - 99 mg/dL 195(K) 932(I) 83  BUN 6 - 20 mg/dL 11 8 9   Creatinine 0.44 - 1.00 mg/dL 7.12 4.58 0.99  Sodium 135 - 145 mmol/L 139 140 139  Potassium 3.5 - 5.1 mmol/L 3.6 3.9 4.3  Chloride 98 - 111 mmol/L 108 103 105  CO2 22 - 32 mmol/L 24 24 26   Calcium 8.9 - 10.3 mg/dL 9.0 9.2 8.9  Total Protein 6.5 - 8.1 g/dL 7.3 6.9 7.0  Total Bilirubin 0.3 - 1.2 mg/dL 0.4 0.4 0.5  Alkaline Phos 38 - 126 U/L 43 54 44  AST 15 - 41 U/L 14(L) 14 12  ALT 0 - 44 U/L 9 7  8      Edman Circle, MD 01/18/2019, 10:16 AM  Cc: Babs Sciara, MD

## 2019-01-22 DIAGNOSIS — E559 Vitamin D deficiency, unspecified: Secondary | ICD-10-CM | POA: Diagnosis not present

## 2019-01-22 DIAGNOSIS — E611 Iron deficiency: Secondary | ICD-10-CM | POA: Diagnosis not present

## 2019-01-22 DIAGNOSIS — E538 Deficiency of other specified B group vitamins: Secondary | ICD-10-CM | POA: Diagnosis not present

## 2019-01-22 DIAGNOSIS — R5383 Other fatigue: Secondary | ICD-10-CM | POA: Diagnosis not present

## 2019-01-22 DIAGNOSIS — R635 Abnormal weight gain: Secondary | ICD-10-CM | POA: Diagnosis not present

## 2019-01-25 ENCOUNTER — Encounter: Payer: Self-pay | Admitting: Gastroenterology

## 2019-01-25 ENCOUNTER — Ambulatory Visit (AMBULATORY_SURGERY_CENTER): Payer: Federal, State, Local not specified - PPO | Admitting: Gastroenterology

## 2019-01-25 VITALS — BP 130/82 | HR 87 | Temp 99.8°F | Resp 16 | Ht 66.5 in | Wt 209.0 lb

## 2019-01-25 DIAGNOSIS — K3189 Other diseases of stomach and duodenum: Secondary | ICD-10-CM | POA: Diagnosis not present

## 2019-01-25 DIAGNOSIS — K449 Diaphragmatic hernia without obstruction or gangrene: Secondary | ICD-10-CM | POA: Diagnosis not present

## 2019-01-25 DIAGNOSIS — K228 Other specified diseases of esophagus: Secondary | ICD-10-CM | POA: Diagnosis not present

## 2019-01-25 DIAGNOSIS — K210 Gastro-esophageal reflux disease with esophagitis: Secondary | ICD-10-CM | POA: Diagnosis not present

## 2019-01-25 DIAGNOSIS — K229 Disease of esophagus, unspecified: Secondary | ICD-10-CM

## 2019-01-25 DIAGNOSIS — K2210 Ulcer of esophagus without bleeding: Secondary | ICD-10-CM

## 2019-01-25 DIAGNOSIS — R1011 Right upper quadrant pain: Secondary | ICD-10-CM | POA: Diagnosis not present

## 2019-01-25 MED ORDER — SODIUM CHLORIDE 0.9 % IV SOLN: 500.0000 mL | Freq: Once | INTRAVENOUS | Status: DC

## 2019-01-25 MED ORDER — PANTOPRAZOLE SODIUM 40 MG PO TBEC: 40.0000 mg | DELAYED_RELEASE_TABLET | Freq: Every day | ORAL | 5 refills | Status: DC

## 2019-01-25 NOTE — Progress Notes (Signed)
Called to room to assist during endoscopic procedure.  Patient ID and intended procedure confirmed with present staff. Received instructions for my participation in the procedure from the performing physician.  

## 2019-01-25 NOTE — Progress Notes (Signed)
No problems noted in the recovery room. maw 

## 2019-01-25 NOTE — Progress Notes (Signed)
I have reviewed the patient's medical history in detail and updated the computerized patient record.

## 2019-01-25 NOTE — Progress Notes (Signed)
To PACU, VSS. Report to [email or web address] 

## 2019-01-25 NOTE — Op Note (Addendum)
[facility] Patient Name: Kristina Lambert Procedure Date: 01/25/2019 3:27 PM MRN: [id number] Endoscopist: Lynann Bologna , MD Age: 38 Referring MD:  Date of Birth: 1981-04-10 Gender: Female Account #: [id number] Procedure:                Upper GI endoscopy Indications:              Abdominal pain in the right upper quadrant Medicines:                Monitored Anesthesia Care Procedure:                Pre-Anesthesia Assessment:                           - Prior to the procedure, a History and Physical                            was performed, and patient medications and                            allergies were reviewed. The patient's tolerance of                            previous anesthesia was also reviewed. The risks                            and benefits of the procedure and the sedation                            options and risks were discussed with the patient.                            All questions were answered, and informed consent                            was obtained. Prior Anticoagulants: The patient has                            taken no previous anticoagulant or antiplatelet                            agents. ASA Grade Assessment: II - A patient with                            mild systemic disease. After reviewing the risks                            and benefits, the patient was deemed in                            satisfactory condition to undergo the procedure.                           After obtaining informed consent, the endoscope was  passed under direct vision. Throughout the                            procedure, the patient's blood pressure, pulse, and                            oxygen saturations were monitored continuously. The                            Endoscope was introduced through the mouth, and                            advanced to the second part of duodenum. The upper                            GI endoscopy was  accomplished without difficulty.                            The patient tolerated the procedure well. Scope In: Scope Out: Findings:                 LA Grade A (one or more mucosal breaks less than 5                            mm, not extending between tops of 2 mucosal folds)                            esophagitis with no bleeding was found 35 cm from                            the incisors. Biopsies were taken with a cold                            forceps for histology from all 4 quadrants directed                            by narrowband imaging.                           A 2 cm hiatal hernia was present.                           Localized minimal inflammation characterized by                            erythema was found in the gastric antrum. Biopsies                            were taken with a cold forceps for histology.                           The examined duodenum was normal. Biopsies for  histology were taken with a cold forceps for                            evaluation of celiac disease. Complications:            No immediate complications. Estimated Blood Loss:     Estimated blood loss: none. Impression:               - LA Grade A reflux esophagitis. Biopsied.                           - 2 cm hiatal hernia.                           - Gastritis. Biopsied. Recommendation:           - Patient has a contact number available for                            emergencies. The signs and symptoms of potential                            delayed complications were discussed with the                            patient. Return to normal activities tomorrow.                            Written discharge instructions were provided to the                            patient.                           - Resume previous diet.                           - Use Protonix (pantoprazole) 40 mg PO daily.                           - No aspirin, ibuprofen, naproxen, or other                             non-steroidal anti-inflammatory drugs.                           - Await pathology results.                           - Return to GI clinic in 12 weeks. Lynann Bolognaajesh Damon Hargrove, MD 01/25/2019 3:42:32 PM This report has been signed electronically.

## 2019-01-25 NOTE — Patient Instructions (Signed)
YOU HAD AN ENDOSCOPIC PROCEDURE TODAY AT THE [facility]:   Refer to the procedure report that was given to you for any specific questions about what was found during the examination.  If the procedure report does not answer your questions, please call your gastroenterologist to clarify.  If you requested that your care partner not be given the details of your procedure findings, then the procedure report has been included in a sealed envelope for you to review at your convenience later.  YOU SHOULD EXPECT: Some feelings of bloating in the abdomen. Passage of more gas than usual.  Walking can help get rid of the air that was put into your GI tract during the procedure and reduce the bloating. If you had a lower endoscopy (such as a colonoscopy or flexible sigmoidoscopy) you may notice spotting of blood in your stool or on the toilet paper. If you underwent a bowel prep for your procedure, you may not have a normal bowel movement for a few days.  Please Note:  You might notice some irritation and congestion in your nose or some drainage.  This is from the oxygen used during your procedure.  There is no need for concern and it should clear up in a day or so.  SYMPTOMS TO REPORT IMMEDIATELY:   Following upper endoscopy (EGD)  Vomiting of blood or coffee ground material  New chest pain or pain under the shoulder blades  Painful or persistently difficult swallowing  New shortness of breath  Fever of 100F or higher  Black, tarry-looking stools  For urgent or emergent issues, a gastroenterologist can be reached at any hour by calling [phone].   DIET:  We do recommend a small meal at first, but then you may proceed to your regular diet.  Drink plenty of fluids but you should avoid alcoholic beverages for 24 hours.  ACTIVITY:  You should plan to take it easy for the rest of today and you should NOT DRIVE or use heavy machinery until tomorrow (because of the sedation medicines used  during the test).    FOLLOW UP: Our staff will call the number listed on your records the next business day following your procedure to check on you and address any questions or concerns that you may have regarding the information given to you following your procedure. If we do not reach you, we will leave a message.  However, if you are feeling well and you are not experiencing any problems, there is no need to return our call.  We will assume that you have returned to your regular daily activities without incident.  If any biopsies were taken you will be contacted by phone or by letter within the next 1-3 weeks.  Please call us at [phone] if you have not heard about the biopsies in 3 weeks.    SIGNATURES/CONFIDENTIALITY: You and/or your care partner have signed paperwork which will be entered into your electronic medical record.  These signatures attest to the fact that that the information above on your After Visit Summary has been reviewed and is understood.  Full responsibility of the confidentiality of this discharge information lies with you and/or your care-partner.    Handouts were given to your care partner on GERD and Gastritis. No aspirin, aspirin products,  ibuprofen, naproxen, advil, motrin, aleve, or other non-steroidal anti-inflammatory drugs. You may resume your other current medications today. Await biopsy results. The office will call you with an appointment to follow up in  the clinic in 12 weeks. Please call if any questions or concerns.

## 2019-01-26 ENCOUNTER — Telehealth: Payer: Self-pay

## 2019-01-26 NOTE — Telephone Encounter (Signed)
  Follow up Call-  Call back number 01/25/2019  Post procedure Call Back phone  # [phone]  Permission to leave phone message Yes  Some recent data might be hidden     Patient questions:  Do you have a fever, pain , or abdominal swelling? No. Pain Score  0 *  Have you tolerated food without any problems? Yes.    Have you been able to return to your normal activities? Yes.    Do you have any questions about your discharge instructions: Diet   No. Medications  No. Follow up visit  No.  Do you have questions or concerns about your Care? No.  Actions: * If pain score is 4 or above: No action needed, pain <4.

## 2019-01-27 DIAGNOSIS — M2550 Pain in unspecified joint: Secondary | ICD-10-CM | POA: Diagnosis not present

## 2019-01-27 DIAGNOSIS — E538 Deficiency of other specified B group vitamins: Secondary | ICD-10-CM | POA: Diagnosis not present

## 2019-01-27 DIAGNOSIS — Z833 Family history of diabetes mellitus: Secondary | ICD-10-CM | POA: Diagnosis not present

## 2019-01-27 DIAGNOSIS — N943 Premenstrual tension syndrome: Secondary | ICD-10-CM | POA: Diagnosis not present

## 2019-01-29 ENCOUNTER — Encounter: Payer: Self-pay | Admitting: Gastroenterology

## 2019-04-12 ENCOUNTER — Ambulatory Visit (INDEPENDENT_AMBULATORY_CARE_PROVIDER_SITE_OTHER): Payer: Federal, State, Local not specified - PPO | Admitting: Family Medicine

## 2019-04-12 ENCOUNTER — Other Ambulatory Visit: Payer: Self-pay

## 2019-04-12 ENCOUNTER — Encounter: Payer: Self-pay | Admitting: Family Medicine

## 2019-04-12 VITALS — Wt 193.0 lb

## 2019-04-12 DIAGNOSIS — M545 Low back pain, unspecified: Secondary | ICD-10-CM

## 2019-04-12 DIAGNOSIS — K2100 Gastro-esophageal reflux disease with esophagitis, without bleeding: Secondary | ICD-10-CM

## 2019-04-12 MED ORDER — SUCRALFATE 1 G PO TABS: ORAL_TABLET | ORAL | 0 refills | Status: DC

## 2019-04-12 MED ORDER — PANTOPRAZOLE SODIUM 40 MG PO TBEC: 40.0000 mg | DELAYED_RELEASE_TABLET | Freq: Every day | ORAL | 5 refills | Status: DC

## 2019-04-12 MED ORDER — TRAMADOL HCL 50 MG PO TABS: ORAL_TABLET | ORAL | 0 refills | Status: DC

## 2019-04-12 MED ORDER — FAMOTIDINE 20 MG PO TABS: ORAL_TABLET | ORAL | 5 refills | Status: DC

## 2019-04-12 NOTE — Addendum Note (Signed)
Addended by: Lilyan Punt A on: 04/12/2019 02:41 PM   Modules accepted: Orders

## 2019-04-12 NOTE — Progress Notes (Signed)
   Subjective:    Patient ID: Kristina Lambert, female    DOB: 1981/07/08, 38 y.o.   MRN: [id number]  HPI  Video and audio Patient was at home I was at office Coronavirus outbreak  Very nice patient Pt is wanting to change acid reflex medication. Last week and half she has developed symptoms. Pt is taking Protonix 40 mg daily. Pt would like to know if there is a higher dose or another med that she can try. Burning in chest/throat area, feeling of something caught in throat, heart burn all weekend.   Patient has significant tight reflux symptoms described as burning in the chest burning in the throat not feeling good denies high fever chills sweats denies nausea vomiting diarrhea.  Energy level fairly decent.  Not under a lot of stress.  Tolerating staying at home Is aware of coronavirus protection Patient has history of reflux had a EGD earlier this year will see gastroenterology later this year for follow-up (Pt is taking thyroid support that has iodine and other components in the med) Virtual Visit via Video Note  I connected with Kristina Lambert on 04/12/19 at  1:10 PM EDT by a video enabled telemedicine application and verified that I am speaking with the correct person using two identifiers.   I discussed the limitations of evaluation and management by telemedicine and the availability of in person appointments. The patient expressed understanding and agreed to proceed.  History of Present Illness:    Observations/Objective:   Assessment and Plan:   Follow Up Instructions:    I discussed the assessment and treatment plan with the patient. The patient was provided an opportunity to ask questions and all were answered. The patient agreed with the plan and demonstrated an understanding of the instructions.   The patient was advised to call back or seek an in-person evaluation if the symptoms worsen or if the condition fails to improve as anticipated.  I provided 15 minutes of  non-face-to-face time during this encounter.   Marlowe [place], LPN  Review of Systems     Objective:   Physical Exam  Unable to do physical exam via video      Assessment & Plan:  Reflux Continue pantoprazole Put bed on 6 inch blocks Avoid tomato based products caffeine and chocolates Add famotidine daily Also Carafate on a regular basis Give [place] feedback within 2 to 3 weeks if not improving Follow-up with GI later this year follow-up with [place] on a regular basis.  Patient also has intermittent lumbar pain cannot take anti-inflammatories would like something stronger than Tylenol to take she will try tramadol sparingly she understands not use on a regular basis because it can become addictive otherwise use Tylenol massage warm compresses

## 2019-04-15 DIAGNOSIS — R5383 Other fatigue: Secondary | ICD-10-CM | POA: Diagnosis not present

## 2019-04-15 DIAGNOSIS — E559 Vitamin D deficiency, unspecified: Secondary | ICD-10-CM | POA: Diagnosis not present

## 2019-04-15 DIAGNOSIS — R51 Headache: Secondary | ICD-10-CM | POA: Diagnosis not present

## 2019-04-15 DIAGNOSIS — N943 Premenstrual tension syndrome: Secondary | ICD-10-CM | POA: Diagnosis not present

## 2019-04-15 DIAGNOSIS — E538 Deficiency of other specified B group vitamins: Secondary | ICD-10-CM | POA: Diagnosis not present

## 2019-04-15 DIAGNOSIS — L659 Nonscarring hair loss, unspecified: Secondary | ICD-10-CM | POA: Diagnosis not present

## 2019-04-15 DIAGNOSIS — M2550 Pain in unspecified joint: Secondary | ICD-10-CM | POA: Diagnosis not present

## 2019-07-06 DIAGNOSIS — R635 Abnormal weight gain: Secondary | ICD-10-CM | POA: Diagnosis not present

## 2019-07-06 DIAGNOSIS — E538 Deficiency of other specified B group vitamins: Secondary | ICD-10-CM | POA: Diagnosis not present

## 2019-07-06 DIAGNOSIS — E611 Iron deficiency: Secondary | ICD-10-CM | POA: Diagnosis not present

## 2019-07-06 DIAGNOSIS — R5383 Other fatigue: Secondary | ICD-10-CM | POA: Diagnosis not present

## 2019-07-06 DIAGNOSIS — E559 Vitamin D deficiency, unspecified: Secondary | ICD-10-CM | POA: Diagnosis not present

## 2019-07-15 DIAGNOSIS — M2550 Pain in unspecified joint: Secondary | ICD-10-CM | POA: Diagnosis not present

## 2019-07-15 DIAGNOSIS — Z833 Family history of diabetes mellitus: Secondary | ICD-10-CM | POA: Diagnosis not present

## 2019-07-15 DIAGNOSIS — N943 Premenstrual tension syndrome: Secondary | ICD-10-CM | POA: Diagnosis not present

## 2019-07-15 DIAGNOSIS — E538 Deficiency of other specified B group vitamins: Secondary | ICD-10-CM | POA: Diagnosis not present

## 2019-12-28 DIAGNOSIS — E039 Hypothyroidism, unspecified: Secondary | ICD-10-CM | POA: Diagnosis not present

## 2019-12-28 DIAGNOSIS — E559 Vitamin D deficiency, unspecified: Secondary | ICD-10-CM | POA: Diagnosis not present

## 2019-12-28 DIAGNOSIS — R5383 Other fatigue: Secondary | ICD-10-CM | POA: Diagnosis not present

## 2020-01-20 DIAGNOSIS — E538 Deficiency of other specified B group vitamins: Secondary | ICD-10-CM | POA: Diagnosis not present

## 2020-01-20 DIAGNOSIS — Z833 Family history of diabetes mellitus: Secondary | ICD-10-CM | POA: Diagnosis not present

## 2020-01-20 DIAGNOSIS — R635 Abnormal weight gain: Secondary | ICD-10-CM | POA: Diagnosis not present

## 2020-01-20 DIAGNOSIS — L659 Nonscarring hair loss, unspecified: Secondary | ICD-10-CM | POA: Diagnosis not present

## 2020-01-20 DIAGNOSIS — M2550 Pain in unspecified joint: Secondary | ICD-10-CM | POA: Diagnosis not present

## 2020-01-20 DIAGNOSIS — R5383 Other fatigue: Secondary | ICD-10-CM | POA: Diagnosis not present

## 2020-06-08 ENCOUNTER — Encounter: Payer: Self-pay | Admitting: Family Medicine

## 2020-06-08 ENCOUNTER — Other Ambulatory Visit: Payer: Self-pay

## 2020-06-08 ENCOUNTER — Ambulatory Visit: Payer: Federal, State, Local not specified - PPO | Admitting: Family Medicine

## 2020-06-08 VITALS — BP 110/74 | HR 119 | Temp 97.3°F | Ht 66.5 in | Wt 205.0 lb

## 2020-06-08 DIAGNOSIS — N898 Other specified noninflammatory disorders of vagina: Secondary | ICD-10-CM

## 2020-06-08 DIAGNOSIS — B80 Enterobiasis: Secondary | ICD-10-CM

## 2020-06-08 LAB — POCT WET PREP (WET MOUNT)
Clue Cells Wet Prep Whiff POC: NEGATIVE
Trichomonas Wet Prep HPF POC: ABSENT

## 2020-06-08 MED ORDER — TRIAMCINOLONE ACETONIDE 0.1 % EX CREA: 1.0000 "application " | TOPICAL_CREAM | Freq: Two times a day (BID) | CUTANEOUS | 0 refills | Status: DC

## 2020-06-08 NOTE — Progress Notes (Signed)
Patient ID: Kristina Lambert, female    DOB: May 19, 1981, 39 y.o.   MRN: [id number]   Chief Complaint  Patient presents with  . Vaginal Itching  . Anal Itching   Subjective:    HPI  Cc-vaginal and rectal  itching. Started 5 days ago.  Pt stating started with severe rectal itching 5 days ago, now feeling like it's spreading to the external vaginal area. No sores/blisters or lesions. No h/o hemorrhoids. Not been around young children recently.   No one else at home with similar itching.  Has not tried any meds at home. No pain with urination.  Has seen a yellow vaginal dc and no pain with intercourse or pelvic pain. No n/v/d.   Medical History Kristina Lambert has a past medical history of Anemia, GERD (gastroesophageal reflux disease), Heart palpitations, History of cholecystectomy (12/11/2018), Hypothyroidism (08/10/2018), and Irregular periods/menstrual cycles (08/10/2018).   Outpatient Encounter Medications as of 06/08/2020  Medication Sig  . Ascorbic Acid (VITAMIN C) 1000 MG tablet Take 1,000 mg by mouth daily.  . Cholecalciferol (VITAMIN D3 PO) Take by mouth.  . co-enzyme Q-10 30 MG capsule Take 100 mg by mouth 3 (three) times daily.  . cyanocobalamin 1000 MCG tablet Take 1,000 mcg by mouth daily.  . famotidine (PEPCID) 20 MG tablet Take one tablet by mouth at bedtime  . Ferrous Fumarate (IRON) 18 MG TBCR Take by mouth.  . vitamin k 100 MCG tablet Take 100 mcg by mouth daily.  . [DISCONTINUED] ibuprofen (ADVIL,MOTRIN) 200 MG tablet Take 400 mg by mouth every 6 (six) hours as needed. Cramps/pain  . triamcinolone cream (KENALOG) 0.1 % Apply 1 application topically 2 (two) times daily. Apply to rash in rectal area for 1 wk.  . [DISCONTINUED] NATURE-THROID 32.5 MG tablet   . [DISCONTINUED] pantoprazole (PROTONIX) 40 MG tablet Take 1 tablet (40 mg total) by mouth daily.  . [DISCONTINUED] Progesterone Micronized (PROGESTERONE PO) Take 75 mg by mouth every other day.  . [DISCONTINUED] sucralfate  (CARAFATE) 1 g tablet Take one tablet by mouth 3 times daily with meals  . [DISCONTINUED] traMADol (ULTRAM) 50 MG tablet Take one tablet by mouth every 6 hours prn back pain. Use sparingly   Facility-Administered Encounter Medications as of 06/08/2020  Medication  . 0.9 %  sodium chloride infusion     Review of Systems  Constitutional: Negative for fever.  Gastrointestinal: Negative for anal bleeding, blood in stool, diarrhea, nausea and vomiting.  Genitourinary: Positive for vaginal discharge. Negative for difficulty urinating, dyspareunia, dysuria, enuresis, frequency, genital sores, hematuria, pelvic pain, urgency and vaginal bleeding.       +Vaginal and rectal itching.  Skin: Negative for rash.     Vitals BP 110/74   Pulse (!) 119   Temp (!) 97.3 F (36.3 C)   Ht 5' 6.5" (1.689 m)   Wt 205 lb (93 kg)   SpO2 98%   BMI 32.59 kg/m   Objective:   Physical Exam Vitals and nursing note reviewed. Exam conducted with a chaperone present.  Constitutional:      General: She is not in acute distress.    Appearance: Normal appearance. She is not ill-appearing.  Genitourinary:    General: Normal vulva.     Vagina: Vaginal discharge (yellowish) present.     Rectum: Normal.     Comments: No CMT, no cervical or vaginal lesions or rash on vulva.  Rectum-normal, no rash, lesion, blisters or hemorrhoid. Skin:    General: Skin is warm and  dry.     Findings: No lesion or rash.  Neurological:     General: No focal deficit present.     Mental Status: She is alert.      Assessment and Plan   1. Pinworm infection - triamcinolone cream (KENALOG) 0.1 %; Apply 1 application topically 2 (two) times daily. Apply to rash in rectal area for 1 wk.  Dispense: 30 g; Refill: 0  2. Vaginal discharge - GC/Chlamydia Probe Amp([facility]) - POCT [facility])   Advising pt to get otc pinworm medication.  Gave handout.  1x dose to treat and treat all family members.  Gave triamcinolone  to use prn for itching.  F/u prn.  Pt in agreement.

## 2020-06-10 LAB — GC/CHLAMYDIA PROBE AMP
Chlamydia trachomatis, NAA: NEGATIVE
Neisseria Gonorrhoeae by PCR: NEGATIVE

## 2020-07-04 DIAGNOSIS — Z01419 Encounter for gynecological examination (general) (routine) without abnormal findings: Secondary | ICD-10-CM | POA: Diagnosis not present

## 2020-07-04 DIAGNOSIS — Z6832 Body mass index (BMI) 32.0-32.9, adult: Secondary | ICD-10-CM | POA: Diagnosis not present

## 2020-07-20 DIAGNOSIS — E611 Iron deficiency: Secondary | ICD-10-CM | POA: Diagnosis not present

## 2020-07-20 DIAGNOSIS — M2550 Pain in unspecified joint: Secondary | ICD-10-CM | POA: Diagnosis not present

## 2020-07-20 DIAGNOSIS — E538 Deficiency of other specified B group vitamins: Secondary | ICD-10-CM | POA: Diagnosis not present

## 2020-07-20 DIAGNOSIS — R5383 Other fatigue: Secondary | ICD-10-CM | POA: Diagnosis not present

## 2020-07-20 DIAGNOSIS — R635 Abnormal weight gain: Secondary | ICD-10-CM | POA: Diagnosis not present

## 2020-07-20 DIAGNOSIS — E559 Vitamin D deficiency, unspecified: Secondary | ICD-10-CM | POA: Diagnosis not present

## 2020-08-10 DIAGNOSIS — Z8669 Personal history of other diseases of the nervous system and sense organs: Secondary | ICD-10-CM | POA: Diagnosis not present

## 2020-08-10 DIAGNOSIS — E538 Deficiency of other specified B group vitamins: Secondary | ICD-10-CM | POA: Diagnosis not present

## 2020-08-10 DIAGNOSIS — R635 Abnormal weight gain: Secondary | ICD-10-CM | POA: Diagnosis not present

## 2020-08-10 DIAGNOSIS — M2550 Pain in unspecified joint: Secondary | ICD-10-CM | POA: Diagnosis not present

## 2020-08-10 DIAGNOSIS — R5383 Other fatigue: Secondary | ICD-10-CM | POA: Diagnosis not present

## 2020-09-21 DIAGNOSIS — R509 Fever, unspecified: Secondary | ICD-10-CM | POA: Diagnosis not present

## 2020-09-21 DIAGNOSIS — R519 Headache, unspecified: Secondary | ICD-10-CM | POA: Diagnosis not present

## 2020-09-21 DIAGNOSIS — Z20828 Contact with and (suspected) exposure to other viral communicable diseases: Secondary | ICD-10-CM | POA: Diagnosis not present

## 2020-09-21 DIAGNOSIS — R05 Cough: Secondary | ICD-10-CM | POA: Diagnosis not present

## 2020-09-21 DIAGNOSIS — U071 COVID-19: Secondary | ICD-10-CM | POA: Diagnosis not present

## 2020-09-22 DIAGNOSIS — R1012 Left upper quadrant pain: Secondary | ICD-10-CM | POA: Diagnosis not present

## 2020-09-22 DIAGNOSIS — R161 Splenomegaly, not elsewhere classified: Secondary | ICD-10-CM | POA: Diagnosis not present

## 2020-09-22 DIAGNOSIS — R079 Chest pain, unspecified: Secondary | ICD-10-CM | POA: Diagnosis not present

## 2020-09-22 DIAGNOSIS — R1011 Right upper quadrant pain: Secondary | ICD-10-CM | POA: Diagnosis not present

## 2020-09-22 DIAGNOSIS — R1013 Epigastric pain: Secondary | ICD-10-CM | POA: Diagnosis not present

## 2020-09-23 DIAGNOSIS — R079 Chest pain, unspecified: Secondary | ICD-10-CM | POA: Diagnosis not present

## 2020-09-23 DIAGNOSIS — R161 Splenomegaly, not elsewhere classified: Secondary | ICD-10-CM | POA: Diagnosis not present

## 2020-09-26 ENCOUNTER — Ambulatory Visit (INDEPENDENT_AMBULATORY_CARE_PROVIDER_SITE_OTHER): Payer: Federal, State, Local not specified - PPO | Admitting: Family Medicine

## 2020-09-26 ENCOUNTER — Other Ambulatory Visit: Payer: Self-pay

## 2020-09-26 ENCOUNTER — Other Ambulatory Visit (HOSPITAL_COMMUNITY)
Admission: RE | Admit: 2020-09-26 | Discharge: 2020-09-26 | Disposition: A | Discharge status: Home / Self Care | Payer: Federal, State, Local not specified - PPO | Source: Ambulatory Visit | Attending: Family Medicine | Admitting: Family Medicine

## 2020-09-26 DIAGNOSIS — M79606 Pain in leg, unspecified: Secondary | ICD-10-CM

## 2020-09-26 DIAGNOSIS — Z20822 Contact with and (suspected) exposure to covid-19: Secondary | ICD-10-CM

## 2020-09-26 LAB — D-DIMER, QUANTITATIVE: D-Dimer, Quant: 0.62 ug/mL-FEU — ABNORMAL HIGH (ref 0.00–0.50)

## 2020-09-26 NOTE — Progress Notes (Signed)
   Subjective:    Patient ID: Kristina Lambert, female    DOB: 10-Aug-1981, 39 y.o.   MRN: [id number]  HPI Pt having leg pain since last Thursday. Off and on. Sore to touch, aches. Significant leg pain discomfort.  Pain is off and on since having Covid Her Covid test was negative but she lost her sense of taste and smell Her husband was positive for Covid She denies high fever chills wheezing difficulty breathing  Review of Systems Please see above    Objective:   Physical Exam No tenderness in the Extremities measured before lungs clear respiratory rate normal heart regular       Assessment & Plan:  1. Pain of lower extremity, unspecified laterality D-dimer if abnormal will need stat ultrasound Patient did have CT scan of the chest in the ER several days ago negative for pulmonary embolus - D-Dimer, Quantitative  2. Exposure to COVID-19 virus Patient will have Covid test today await the results patient knows to stay isolated for now - Novel Coronavirus, NAA ([facility])

## 2020-09-27 ENCOUNTER — Other Ambulatory Visit: Payer: Self-pay

## 2020-09-27 ENCOUNTER — Other Ambulatory Visit: Payer: Self-pay | Admitting: *Deleted

## 2020-09-27 ENCOUNTER — Ambulatory Visit (HOSPITAL_COMMUNITY)
Admission: RE | Admit: 2020-09-27 | Discharge: 2020-09-27 | Disposition: A | Discharge status: Home / Self Care | Payer: Federal, State, Local not specified - PPO | Source: Ambulatory Visit | Attending: Family Medicine | Admitting: Family Medicine

## 2020-09-27 DIAGNOSIS — R7989 Other specified abnormal findings of blood chemistry: Secondary | ICD-10-CM

## 2020-09-27 MED ORDER — CHLORZOXAZONE 500 MG PO TABS: 500.0000 mg | ORAL_TABLET | Freq: Three times a day (TID) | ORAL | 0 refills | Status: DC | PRN

## 2020-09-27 NOTE — Addendum Note (Signed)
Addended by: Margaretha Sheffield on: 09/27/2020 05:10 PM   Modules accepted: Orders

## 2020-09-28 ENCOUNTER — Other Ambulatory Visit: Payer: Self-pay | Admitting: *Deleted

## 2020-09-28 DIAGNOSIS — R7989 Other specified abnormal findings of blood chemistry: Secondary | ICD-10-CM

## 2020-09-28 LAB — SARS-COV-2, NAA 2 DAY TAT

## 2020-09-28 LAB — NOVEL CORONAVIRUS, NAA: SARS-CoV-2, NAA: DETECTED — AB

## 2020-09-29 ENCOUNTER — Telehealth: Payer: Self-pay | Admitting: Infectious Diseases

## 2020-09-29 NOTE — Telephone Encounter (Signed)
So noted thank you 

## 2020-09-29 NOTE — Telephone Encounter (Signed)
Called to Discuss with patient about Covid symptoms and the use of the monoclonal antibody infusion for those with mild to moderate Covid symptoms and at a high risk of hospitalization.     Pt appears to qualify for this infusion due to co-morbid conditions and/or a member of an at-risk group in accordance with the FDA Emergency Use Authorization.    Unable to reach pt  LVM for her requesting call back to call   Will route to PCP    Rexene Alberts, MSN, NP-C [facility]  [email or web address] Pager: [phone] Office: [phone] [facility] Main Line: [phone]

## 2020-10-03 ENCOUNTER — Ambulatory Visit (HOSPITAL_COMMUNITY)
Admission: RE | Admit: 2020-10-03 | Discharge: 2020-10-03 | Disposition: A | Discharge status: Home / Self Care | Payer: Federal, State, Local not specified - PPO | Source: Ambulatory Visit | Attending: Family Medicine | Admitting: Family Medicine

## 2020-10-03 ENCOUNTER — Telehealth: Payer: Self-pay | Admitting: *Deleted

## 2020-10-03 ENCOUNTER — Other Ambulatory Visit: Payer: Self-pay

## 2020-10-03 ENCOUNTER — Telehealth (INDEPENDENT_AMBULATORY_CARE_PROVIDER_SITE_OTHER): Payer: Federal, State, Local not specified - PPO | Admitting: Family Medicine

## 2020-10-03 DIAGNOSIS — M79662 Pain in left lower leg: Secondary | ICD-10-CM | POA: Diagnosis not present

## 2020-10-03 DIAGNOSIS — R7989 Other specified abnormal findings of blood chemistry: Secondary | ICD-10-CM | POA: Insufficient documentation

## 2020-10-03 DIAGNOSIS — M79605 Pain in left leg: Secondary | ICD-10-CM | POA: Diagnosis not present

## 2020-10-03 DIAGNOSIS — N3000 Acute cystitis without hematuria: Secondary | ICD-10-CM

## 2020-10-03 DIAGNOSIS — M79604 Pain in right leg: Secondary | ICD-10-CM | POA: Diagnosis not present

## 2020-10-03 MED ORDER — NITROFURANTOIN MONOHYD MACRO 100 MG PO CAPS: 100.0000 mg | ORAL_CAPSULE | Freq: Two times a day (BID) | ORAL | 0 refills | Status: DC

## 2020-10-03 NOTE — Progress Notes (Signed)
   Subjective:    Patient ID: Kristina Lambert, female    DOB: [birth year], 39 y.o.   MRN: [id number]  HPI Dysuria urinary frequency over the past 24 hours denies any severe nausea. In addition to this leg pain has upcoming ultrasound later today recent D-dimer elevated Patient calls with dysuria that started yesterday. Patient diagnosed with Covid mid last week.   Virtual Visit via Video Note  I connected with Kristina Lambert on 10/03/20 at  1:10 PM EDT by a video enabled telemedicine application and verified that I am speaking with the correct person using two identifiers.  Location: Patient: home Provider: office   I discussed the limitations of evaluation and management by telemedicine and the availability of in person appointments. The patient expressed understanding and agreed to proceed.  History of Present Illness:    Observations/Objective:   Assessment and Plan:   Follow Up Instructions:    I discussed the assessment and treatment plan with the patient. The patient was provided an opportunity to ask questions and all were answered. The patient agreed with the plan and demonstrated an understanding of the instructions.   The patient was advised to call back or seek an in-person evaluation if the symptoms worsen or if the condition fails to improve as anticipated.  I provided 15 minutes of non-face-to-face time during this encounter.      Review of Systems Please see above    Objective:   Physical Exam  Today's visit was via telephone Physical exam was not possible for this visit       Assessment & Plan:  UTI Antibiotic prescribed warning signs discussed follow-up if progressive troubles Doppler study later today await results Post Covid symptoms should gradually get better

## 2020-10-03 NOTE — Telephone Encounter (Signed)
[name], sadik are scheduled for a virtual visit with your provider today.    Just as we do with appointments in the office, we must obtain your consent to participate.  Your consent will be active for this visit and any virtual visit you may have with one of our providers in the next 365 days.    If you have a MyChart account, I can also send a copy of this consent to you electronically.  All virtual visits are billed to your insurance company just like a traditional visit in the office.  As this is a virtual visit, video technology does not allow for your provider to perform a traditional examination.  This may limit your provider's ability to fully assess your condition.  If your provider identifies any concerns that need to be evaluated in person or the need to arrange testing such as labs, EKG, etc, we will make arrangements to do so.    Although advances in technology are sophisticated, we cannot ensure that it will always work on either your end or our end.  If the connection with a video visit is poor, we may have to switch to a telephone visit.  With either a video or telephone visit, we are not always able to ensure that we have a secure connection.   I need to obtain your verbal consent now.   Are you willing to proceed with your visit today?   Kristina Lambert has provided verbal consent on 10/03/2020 for a virtual visit (video or telephone).   Kathleen Lime, RN 10/03/2020  11:27 AM

## 2020-10-05 DIAGNOSIS — R1030 Lower abdominal pain, unspecified: Secondary | ICD-10-CM | POA: Diagnosis not present

## 2020-10-05 DIAGNOSIS — R1031 Right lower quadrant pain: Secondary | ICD-10-CM | POA: Diagnosis not present

## 2020-10-05 DIAGNOSIS — N3000 Acute cystitis without hematuria: Secondary | ICD-10-CM | POA: Diagnosis not present

## 2020-10-05 DIAGNOSIS — B9689 Other specified bacterial agents as the cause of diseases classified elsewhere: Secondary | ICD-10-CM | POA: Diagnosis not present

## 2020-10-20 ENCOUNTER — Telehealth: Payer: Self-pay | Admitting: Family Medicine

## 2020-10-20 MED ORDER — PANTOPRAZOLE SODIUM 40 MG PO TBEC: 40.0000 mg | DELAYED_RELEASE_TABLET | Freq: Every day | ORAL | 1 refills | Status: DC

## 2020-10-20 NOTE — Addendum Note (Signed)
Addended by: Haze Rushing on: 10/20/2020 01:35 PM   Modules accepted: Orders

## 2020-10-20 NOTE — Telephone Encounter (Signed)
Rx sent, pt notified. 

## 2020-10-20 NOTE — Telephone Encounter (Signed)
Pt would like refill on Protonix. Pt states Dr.Scott refilled this about a year ago and states that if she had a flare up, she could get refill. Pt went to ED with flare up and they gave her a 30 day supply. Pt has a few pills left and would like a refill. Please advise. Thank you  [place] [phone]

## 2020-10-20 NOTE — Telephone Encounter (Signed)
May have 6 months refill 

## 2020-10-25 IMAGING — US ULTRASOUND ABDOMEN COMPLETE
1 series · 14 of 25 positions shown · non-contrast
Comparison: CT 06/18/2011

CLINICAL DATA: Epigastric pain

EXAM:
ABDOMEN ULTRASOUND COMPLETE

[Series 1: ultrasound abdomen complete · 0.21mm/px · 14 of 101 slices shown]
[im 1/101]
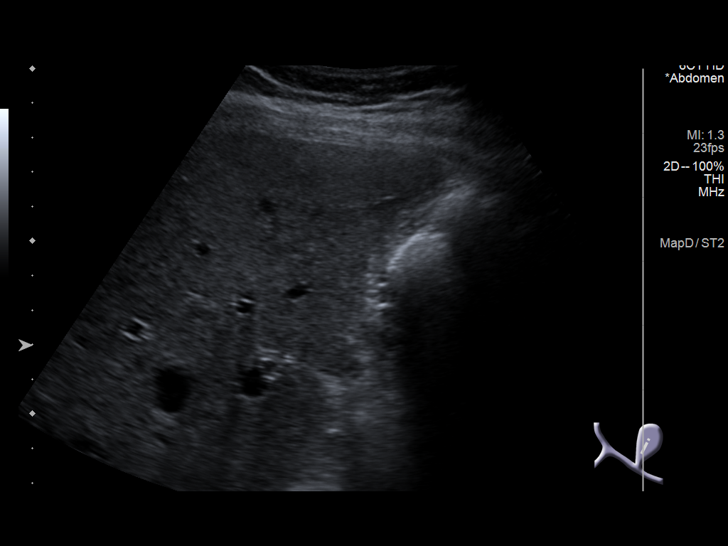
[im 9/101]
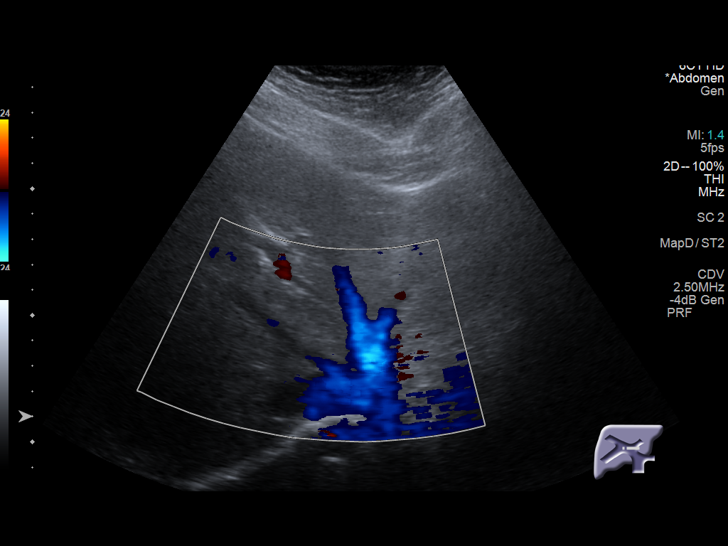
[im 17/101]
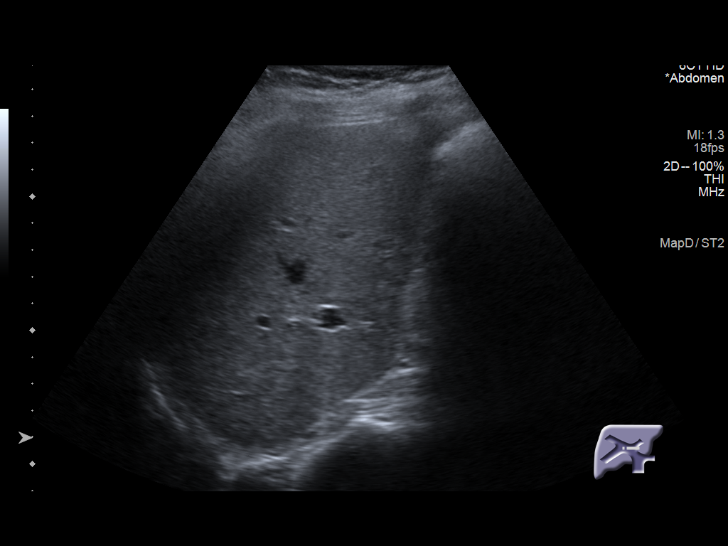
[im 26/101]
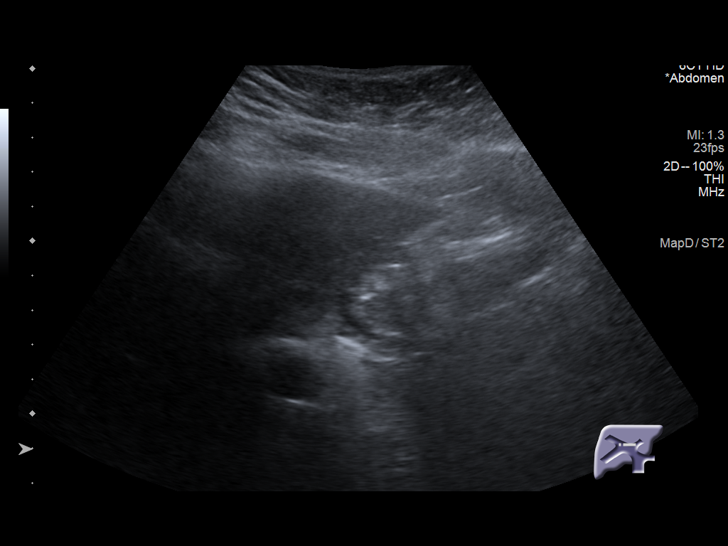
[im 34/101]
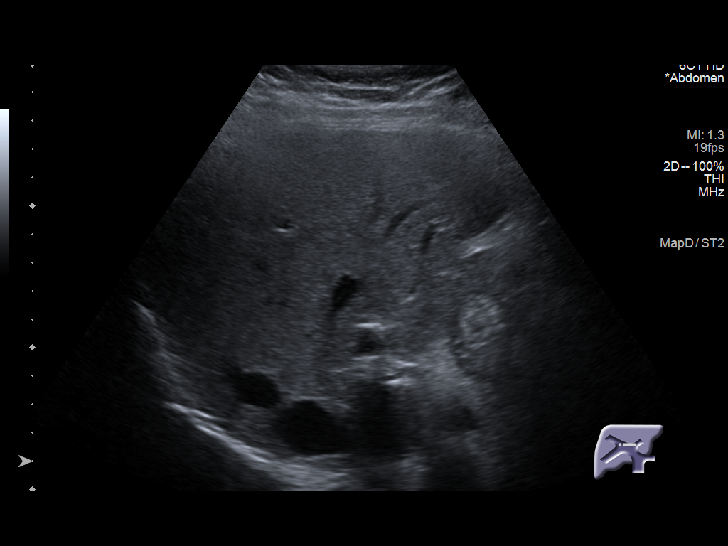
[im 38/101]
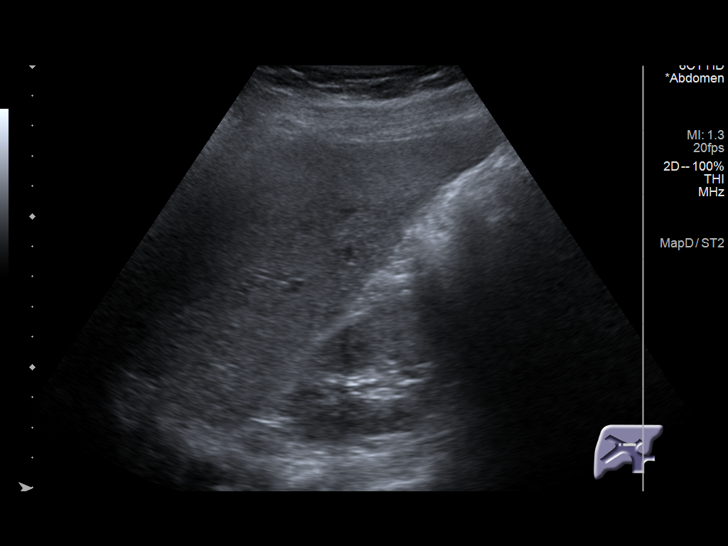
[im 46/101]
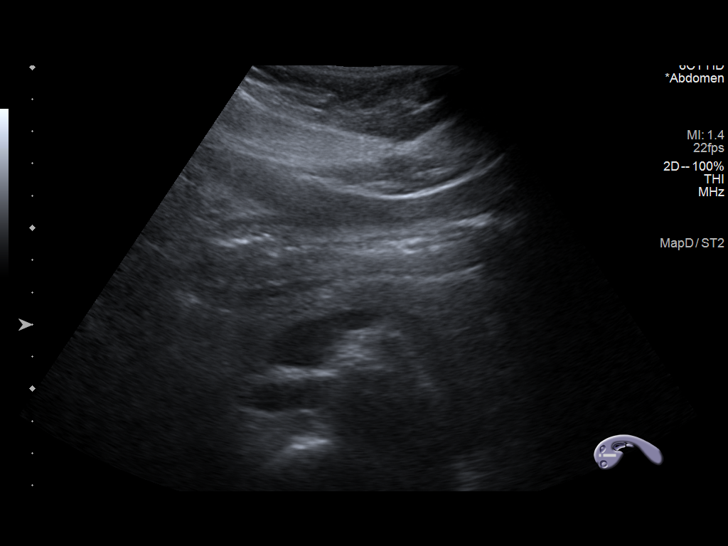
[im 55/101]
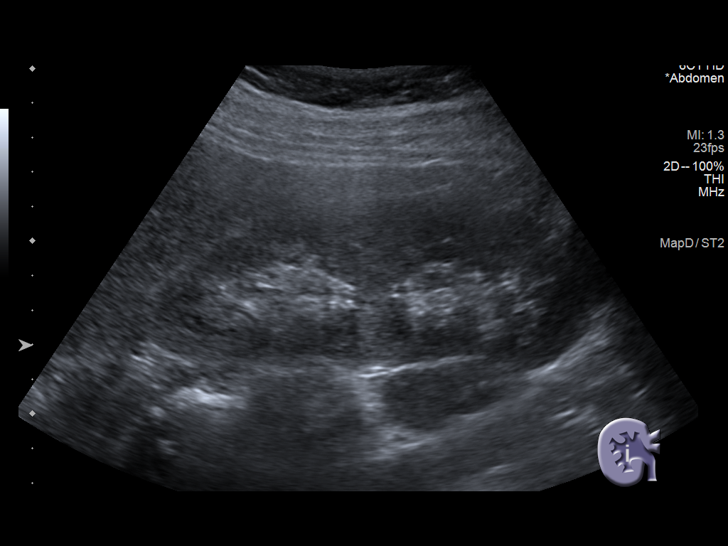
[im 63/101]
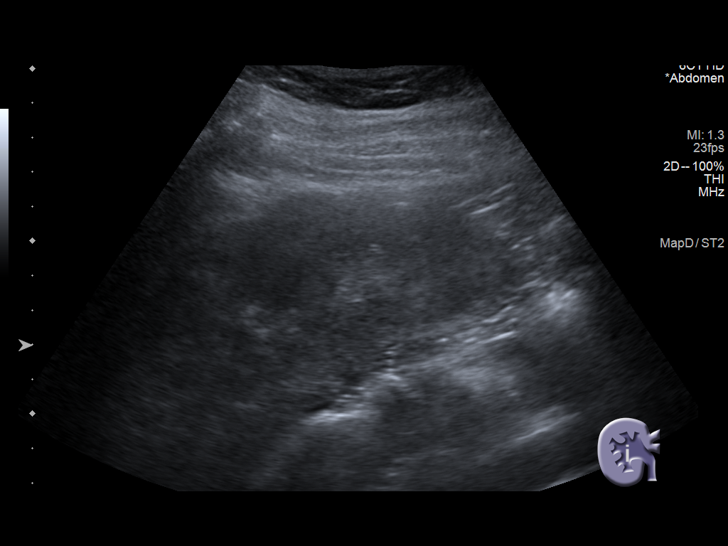
[im 67/101]
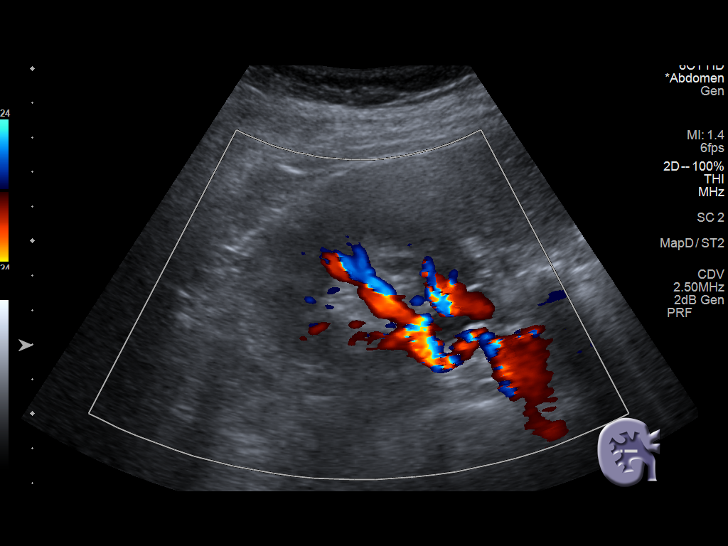
[im 76/101]
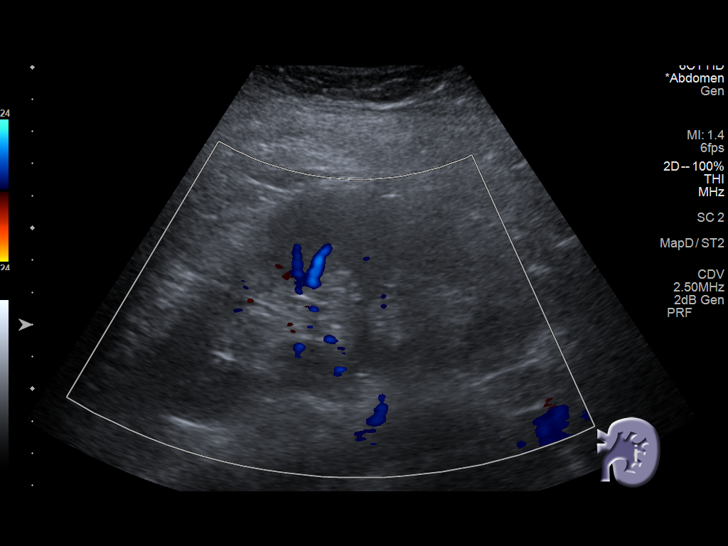
[im 84/101]
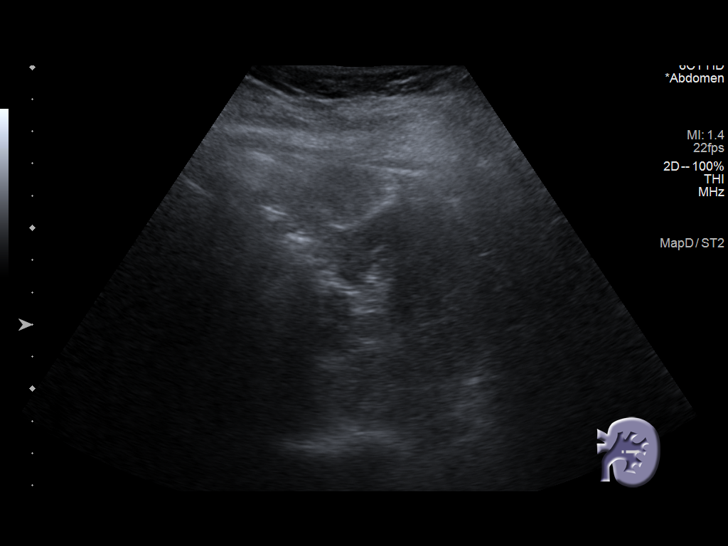
[im 92/101]
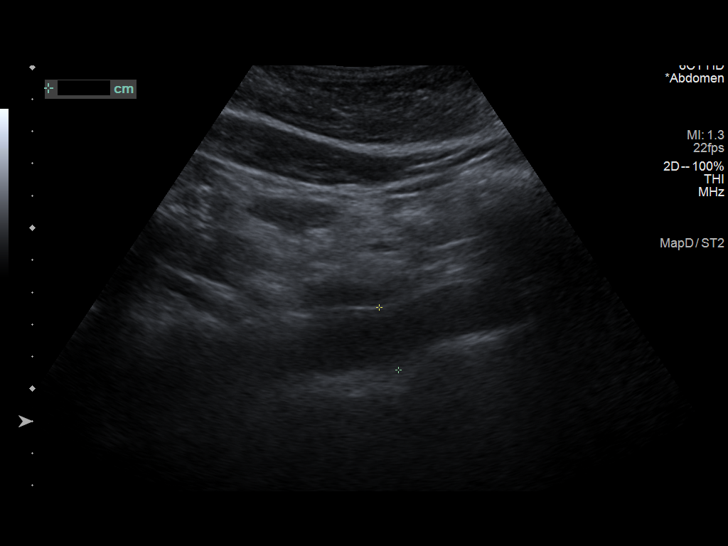
[im 101/101]
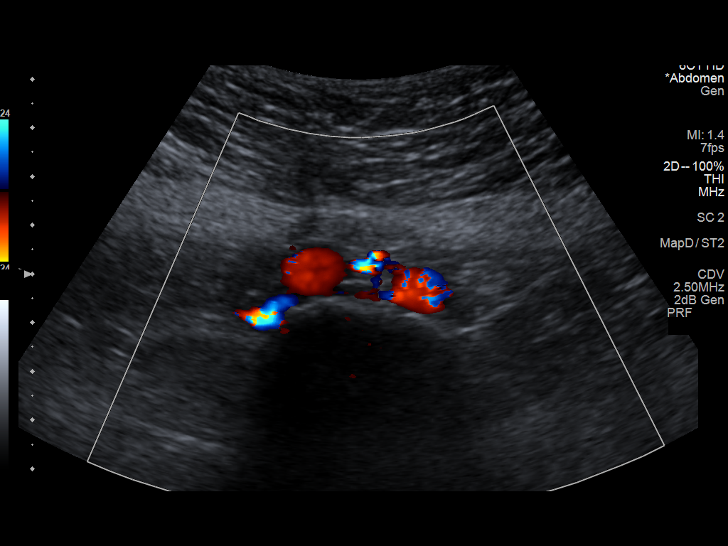

[14 of 25 positions shown; findings below may reference images not displayed]

FINDINGS: Gallbladder: Status post cholecystectomy

Common bile duct: Diameter: 2.9 mm

Liver: No focal lesion identified. Within normal limits in
parenchymal echogenicity. Portal vein is patent on color Doppler
imaging with normal direction of blood flow towards the liver.

IVC: No abnormality visualized.

Pancreas: Visualized portion unremarkable.

Spleen: Size and appearance within normal limits.

Right Kidney: Length: 12.7 cm. Cortical echogenicity within normal
limits. Prominent right extrarenal pelvis.

Left Kidney: Length: 12.8 cm. Echogenicity within normal limits. No
mass or hydronephrosis visualized.

Abdominal aorta: No aneurysm visualized.

Other findings: None.
IMPRESSION: Negative abdominal ultrasound.  Status post cholecystectomy.

## 2020-12-26 ENCOUNTER — Ambulatory Visit: Payer: Federal, State, Local not specified - PPO | Admitting: Family Medicine

## 2020-12-26 DIAGNOSIS — N390 Urinary tract infection, site not specified: Secondary | ICD-10-CM | POA: Diagnosis not present

## 2020-12-26 DIAGNOSIS — R300 Dysuria: Secondary | ICD-10-CM | POA: Diagnosis not present

## 2021-02-09 DIAGNOSIS — R5383 Other fatigue: Secondary | ICD-10-CM | POA: Diagnosis not present

## 2021-02-09 DIAGNOSIS — R7989 Other specified abnormal findings of blood chemistry: Secondary | ICD-10-CM | POA: Diagnosis not present

## 2021-02-09 DIAGNOSIS — M2550 Pain in unspecified joint: Secondary | ICD-10-CM | POA: Diagnosis not present

## 2021-02-09 DIAGNOSIS — L659 Nonscarring hair loss, unspecified: Secondary | ICD-10-CM | POA: Diagnosis not present

## 2021-02-09 DIAGNOSIS — Z1589 Genetic susceptibility to other disease: Secondary | ICD-10-CM | POA: Diagnosis not present

## 2021-02-09 DIAGNOSIS — E559 Vitamin D deficiency, unspecified: Secondary | ICD-10-CM | POA: Diagnosis not present

## 2021-02-09 DIAGNOSIS — E538 Deficiency of other specified B group vitamins: Secondary | ICD-10-CM | POA: Diagnosis not present

## 2021-02-09 DIAGNOSIS — Z833 Family history of diabetes mellitus: Secondary | ICD-10-CM | POA: Diagnosis not present

## 2021-02-16 DIAGNOSIS — R635 Abnormal weight gain: Secondary | ICD-10-CM | POA: Diagnosis not present

## 2021-02-16 DIAGNOSIS — R5383 Other fatigue: Secondary | ICD-10-CM | POA: Diagnosis not present

## 2021-02-16 DIAGNOSIS — R519 Headache, unspecified: Secondary | ICD-10-CM | POA: Diagnosis not present

## 2021-02-16 DIAGNOSIS — M2550 Pain in unspecified joint: Secondary | ICD-10-CM | POA: Diagnosis not present

## 2021-03-19 ENCOUNTER — Encounter: Payer: Self-pay | Admitting: Family Medicine

## 2021-03-19 ENCOUNTER — Ambulatory Visit (HOSPITAL_COMMUNITY)
Admission: RE | Admit: 2021-03-19 | Discharge: 2021-03-19 | Disposition: A | Discharge status: Home / Self Care | Payer: Federal, State, Local not specified - PPO | Source: Ambulatory Visit | Attending: Family Medicine | Admitting: Family Medicine

## 2021-03-19 ENCOUNTER — Other Ambulatory Visit: Payer: Self-pay

## 2021-03-19 ENCOUNTER — Ambulatory Visit: Payer: Federal, State, Local not specified - PPO | Admitting: Family Medicine

## 2021-03-19 ENCOUNTER — Other Ambulatory Visit (HOSPITAL_COMMUNITY)
Admission: RE | Admit: 2021-03-19 | Discharge: 2021-03-19 | Disposition: A | Discharge status: Home / Self Care | Payer: Federal, State, Local not specified - PPO | Source: Ambulatory Visit | Attending: Family Medicine | Admitting: Family Medicine

## 2021-03-19 VITALS — BP 122/86 | Temp 97.0°F | Ht 66.5 in | Wt 212.0 lb

## 2021-03-19 DIAGNOSIS — R102 Pelvic and perineal pain: Secondary | ICD-10-CM | POA: Insufficient documentation

## 2021-03-19 DIAGNOSIS — R197 Diarrhea, unspecified: Secondary | ICD-10-CM | POA: Diagnosis not present

## 2021-03-19 DIAGNOSIS — R109 Unspecified abdominal pain: Secondary | ICD-10-CM | POA: Diagnosis not present

## 2021-03-19 DIAGNOSIS — Z9049 Acquired absence of other specified parts of digestive tract: Secondary | ICD-10-CM | POA: Diagnosis not present

## 2021-03-19 DIAGNOSIS — K5732 Diverticulitis of large intestine without perforation or abscess without bleeding: Secondary | ICD-10-CM | POA: Insufficient documentation

## 2021-03-19 DIAGNOSIS — K5792 Diverticulitis of intestine, part unspecified, without perforation or abscess without bleeding: Secondary | ICD-10-CM

## 2021-03-19 DIAGNOSIS — K449 Diaphragmatic hernia without obstruction or gangrene: Secondary | ICD-10-CM | POA: Insufficient documentation

## 2021-03-19 DIAGNOSIS — N888 Other specified noninflammatory disorders of cervix uteri: Secondary | ICD-10-CM | POA: Diagnosis not present

## 2021-03-19 LAB — POCT URINALYSIS DIPSTICK
Ketones, UA: POSITIVE
Spec Grav, UA: 1.015 (ref 1.010–1.025)
pH, UA: 6 (ref 5.0–8.0)

## 2021-03-19 LAB — CBC WITH DIFFERENTIAL/PLATELET
Abs Immature Granulocytes: 0.02 10*3/uL (ref 0.00–0.07)
Basophils Absolute: 0 10*3/uL (ref 0.0–0.1)
Basophils Relative: 0 %
Eosinophils Absolute: 0 10*3/uL (ref 0.0–0.5)
Eosinophils Relative: 0 %
HCT: 43.6 % (ref 36.0–46.0)
Hemoglobin: 14.2 g/dL (ref 12.0–15.0)
Immature Granulocytes: 0 %
Lymphocytes Relative: 11 %
Lymphs Abs: 0.8 10*3/uL (ref 0.7–4.0)
MCH: 30 pg (ref 26.0–34.0)
MCHC: 32.6 g/dL (ref 30.0–36.0)
MCV: 92 fL (ref 80.0–100.0)
Monocytes Absolute: 0.5 10*3/uL (ref 0.1–1.0)
Monocytes Relative: 6 %
Neutro Abs: 6.1 10*3/uL (ref 1.7–7.7)
Neutrophils Relative %: 83 %
Platelets: 224 10*3/uL (ref 150–400)
RBC: 4.74 MIL/uL (ref 3.87–5.11)
RDW: 13.5 % (ref 11.5–15.5)
WBC: 7.4 10*3/uL (ref 4.0–10.5)
nRBC: 0 % (ref 0.0–0.2)

## 2021-03-19 LAB — COMPREHENSIVE METABOLIC PANEL
ALT: 12 U/L (ref 0–44)
AST: 15 U/L (ref 15–41)
Albumin: 4.2 g/dL (ref 3.5–5.0)
Alkaline Phosphatase: 50 U/L (ref 38–126)
Anion gap: 13 (ref 5–15)
BUN: 8 mg/dL (ref 6–20)
CO2: 17 mmol/L — ABNORMAL LOW (ref 22–32)
Calcium: 9.2 mg/dL (ref 8.9–10.3)
Chloride: 105 mmol/L (ref 98–111)
Creatinine, Ser: 0.62 mg/dL (ref 0.44–1.00)
GFR, Estimated: >60 mL/min (ref >60)
Glucose, Bld: 96 mg/dL (ref 70–99)
Potassium: 3.6 mmol/L (ref 3.5–5.1)
Sodium: 135 mmol/L (ref 135–145)
Total Bilirubin: 1.1 mg/dL (ref 0.3–1.2)
Total Protein: 7.8 g/dL (ref 6.5–8.1)

## 2021-03-19 LAB — AMYLASE: Amylase: 45 U/L (ref 28–100)

## 2021-03-19 LAB — LIPASE, BLOOD: Lipase: 28 U/L (ref 11–51)

## 2021-03-19 LAB — POCT URINE PREGNANCY: Preg Test, Ur: NEGATIVE

## 2021-03-19 MED ORDER — METRONIDAZOLE 500 MG PO TABS: 500.0000 mg | ORAL_TABLET | Freq: Three times a day (TID) | ORAL | 0 refills | Status: DC

## 2021-03-19 MED ORDER — CIPROFLOXACIN HCL 500 MG PO TABS: 500.0000 mg | ORAL_TABLET | Freq: Two times a day (BID) | ORAL | 0 refills | Status: DC

## 2021-03-19 NOTE — Progress Notes (Signed)
Patient ID: Kristina Lambert, female    DOB: 09/20/1981, 40 y.o.   MRN: [id number]   Chief Complaint  Patient presents with  . Abdominal Pain    Left lower side since weekend- also had 3 loose stools on Saturday but it has resolved   Subjective:  CC: left side pain  This is a new problem.  Presents today for an acute visit with left-sided pain.  Symptoms are worse with a deep breath, standing, walking, and lying on her right side.  Symptoms started on Saturday evening.  Symptoms followed 3 loose stools on Saturday.  She denies any injury, no abnormal activities.  Has tried heating pad for the pain.  Severity is 8/10.  Symptoms have not worsened or improved since onset.  Denies fever, chills, chest pain, shortness of breath.  Endorses abdominal pain and feeling bloated.    Medical History Kristina Lambert has a past medical history of Anemia, GERD (gastroesophageal reflux disease), Heart palpitations, History of cholecystectomy (12/11/2018), Hypothyroidism (08/10/2018), and Irregular periods/menstrual cycles (08/10/2018).   Outpatient Encounter Medications as of 03/19/2021  Medication Sig  . Ascorbic Acid (VITAMIN C) 1000 MG tablet Take 1,000 mg by mouth daily.  . chlorzoxazone (PARAFON FORTE DSC) 500 MG tablet Take 1 tablet (500 mg total) by mouth 3 (three) times daily as needed for muscle spasms.  . Cholecalciferol (VITAMIN D3 PO) Take by mouth.  . co-enzyme Q-10 30 MG capsule Take 100 mg by mouth 3 (three) times daily.  . cyanocobalamin 1000 MCG tablet Take 1,000 mcg by mouth daily.  . famotidine (PEPCID) 20 MG tablet Take one tablet by mouth at bedtime  . Ferrous Fumarate (IRON) 18 MG TBCR Take by mouth.  . nitrofurantoin, macrocrystal-monohydrate, (MACROBID) 100 MG capsule Take 1 capsule (100 mg total) by mouth 2 (two) times daily.  . pantoprazole (PROTONIX) 40 MG tablet Take 1 tablet (40 mg total) by mouth daily.  Marland Kitchen triamcinolone cream (KENALOG) 0.1 % Apply 1 application topically 2 (two) times  daily. Apply to rash in rectal area for 1 wk.  . vitamin k 100 MCG tablet Take 100 mcg by mouth daily.   Facility-Administered Encounter Medications as of 03/19/2021  Medication  . 0.9 %  sodium chloride infusion     Review of Systems  Constitutional: Negative for chills and fever.  Respiratory: Negative for shortness of breath.   Cardiovascular: Negative for chest pain.  Gastrointestinal: Positive for abdominal pain. Negative for blood in stool, nausea and vomiting.       Felt bloated with loose stools on Saturday. No appetite.      Vitals BP 122/86   Temp (!) 97 F (36.1 C) (Oral)   Ht 5' 6.5" (1.689 m)   Wt 212 lb (96.2 kg)   SpO2 97%   BMI 33.71 kg/m   Objective:   Physical Exam Vitals and nursing note reviewed.  Constitutional:      General: She is not in acute distress.    Appearance: Normal appearance.  Cardiovascular:     Rate and Rhythm: Normal rate and regular rhythm.     Heart sounds: Normal heart sounds.  Pulmonary:     Effort: Pulmonary effort is normal.     Breath sounds: Normal breath sounds.  Abdominal:     General: Bowel sounds are normal.     Tenderness: There is abdominal tenderness in the epigastric area, periumbilical area, left upper quadrant and left lower quadrant. There is left CVA tenderness. There is no rebound.  Skin:  General: Skin is warm and dry.  Neurological:     General: No focal deficit present.     Mental Status: She is alert.  Psychiatric:        Behavior: Behavior normal.     Results for orders placed or performed in visit on 03/19/21  POCT urine pregnancy  Result Value Ref Range   Preg Test, Ur Negative Negative  POCT urinalysis dipstick  Result Value Ref Range   Color, UA     Clarity, UA     Glucose, UA     Bilirubin, UA     Ketones, UA positive    Spec Grav, UA 1.015 1.010 - 1.025   Blood, UA     pH, UA 6.0 5.0 - 8.0   Protein, UA     Urobilinogen, UA     Nitrite, UA     Leukocytes, UA     Appearance      Odor      Assessment and Plan   1. Pelvic pain - POCT urine pregnancy - POCT urinalysis dipstick - CBC with Differential/Platelet - Comprehensive metabolic panel - Lipase - Amylase - CT Abdomen Pelvis Wo Contrast  2. Left sided abdominal pain - CBC with Differential/Platelet - Comprehensive metabolic panel - Lipase - Amylase - CT Abdomen Pelvis Wo Contrast   Consulted with Dr. Lilyan Punt while patient in the office.  Abdominal pain concerning for serious pathology.  Will order stat labs, and stat CT scan for further evaluation.  Urine negative for infection and pregnancy.  Update: CT scan shows acute uncomplicated diverticulitis.  Spoke with Verlon Au, explained findings, antibiotics sent to pharmacy.  Discussed cervical/cyst findings, reports she has a history of cyst, sees [facility], up-to-date on Pap smears, normal result, next Pap smear due in August.  Will send results to [facility].  Discussed diet.  Agrees with plan of care discussed today. Understands warning signs to seek further care: chest pain, shortness of breath, any significant change in health.  Understands to follow-up in 1 week to ensure resolution of symptoms.  Warning signs discussed if pain worsens, changes, seek care at the emergency department.  Dorena Bodo, NP 03/19/21

## 2021-03-19 NOTE — Patient Instructions (Signed)
Diverticulitis  Diverticulitis is when small pouches in your colon (large intestine) get infected or swollen. This causes pain in the belly (abdomen) and watery poop (diarrhea). These pouches are called diverticula. The pouches form in people who have a condition called diverticulosis. What are the causes? This condition may be caused by poop (stool) that gets trapped in the pouches in your colon. The poop lets germs (bacteria) grow in the pouches. This causes the infection. What increases the risk? You are more likely to get this condition if you have small pouches in your colon. The risk is higher if:  You are overweight or very overweight (obese).  You do not exercise enough.  You drink alcohol.  You smoke or use products with tobacco in them.  You eat a diet that has a lot of red meat such as beef, pork, or lamb.  You eat a diet that does not have enough fiber in it.  You are older than 40 years of age. What are the signs or symptoms?  Pain in the belly. Pain is often on the left side, but it may be in other areas.  Fever and feeling cold.  Feeling like you may vomit.  Vomiting.  Having cramps.  Feeling full.  Changes to how often you poop.  Blood in your poop. How is this treated? Most cases are treated at home by:  Taking over-the-counter pain medicines.  Following a clear liquid diet.  Taking antibiotic medicines.  Resting. Very bad cases may need to be treated at a hospital. This may include:  Not eating or drinking.  Taking prescription pain medicine.  Getting antibiotic medicines through an IV tube.  Getting fluid and food through an IV tube.  Having surgery. When you are feeling better, your doctor may tell you to have a test to check your colon (colonoscopy). Follow these instructions at home: Medicines  Take over-the-counter and prescription medicines only as told by your doctor. These include: ? Antibiotics. ? Pain medicines. ? Fiber  pills. ? Probiotics. ? Stool softeners.  If you were prescribed an antibiotic medicine, take it as told by your doctor. Do not stop taking the antibiotic even if you start to feel better.  Ask your doctor if the medicine prescribed to you requires you to avoid driving or using machinery. Eating and drinking  Follow a diet as told by your doctor.  When you feel better, your doctor may tell you to change your diet. You may need to eat a lot of fiber. Fiber makes it easier to poop (have a bowel movement). Foods with fiber include: ? Berries. ? Beans. ? Lentils. ? Green vegetables.  Avoid eating red meat.   General instructions  Do not use any products that contain nicotine or tobacco, such as cigarettes, e-cigarettes, and chewing tobacco. If you need help quitting, ask your doctor.  Exercise 3 or more times a week. Try to get 30 minutes each time. Exercise enough to sweat and make your heart beat faster.  Keep all follow-up visits as told by your doctor. This is important. Contact a doctor if:  Your pain does not get better.  You are not pooping like normal. Get help right away if:  Your pain gets worse.  Your symptoms do not get better.  Your symptoms get worse very fast.  You have a fever.  You vomit more than one time.  You have poop that is: ? Bloody. ? Black. ? Tarry. Summary  This condition happens when   small pouches in your colon get infected or swollen.  Take medicines only as told by your doctor.  Follow a diet as told by your doctor.  Keep all follow-up visits as told by your doctor. This is important. This information is not intended to replace advice given to you by your health care provider. Make sure you discuss any questions you have with your health care provider. Document Revised: 09/27/2019 Document Reviewed: 09/27/2019 Elsevier Patient Education  2021 [employer]vier Inc.  

## 2021-03-20 ENCOUNTER — Other Ambulatory Visit: Payer: Self-pay | Admitting: Family Medicine

## 2021-03-20 ENCOUNTER — Telehealth: Payer: Self-pay

## 2021-03-20 DIAGNOSIS — K5792 Diverticulitis of intestine, part unspecified, without perforation or abscess without bleeding: Secondary | ICD-10-CM

## 2021-03-20 MED ORDER — LEVOFLOXACIN 500 MG PO TABS: 500.0000 mg | ORAL_TABLET | Freq: Every day | ORAL | 0 refills | Status: DC

## 2021-03-20 NOTE — Progress Notes (Signed)
Change due to diarrhea

## 2021-03-20 NOTE — Telephone Encounter (Signed)
Took one last night and earlier this morning; having very watery stool. Started going to bathroom around midnight. Headache also when taking meds. Pt was placed on Cipro and Flagyl.  Can pt take Flagyl only?  Please advise. Thank you

## 2021-03-20 NOTE — Telephone Encounter (Signed)
Pt contacted and verbalized understanding.  

## 2021-03-20 NOTE — Telephone Encounter (Signed)
Patient states medication given on Monday is causing her runny stools please advise

## 2021-03-20 NOTE — Telephone Encounter (Signed)
Okay, please stop Cipro. I ordered Levaquin, same drug class, hoping she will tolerate it better. Take Levaquin and Flagyl.  Please let us know, Clydie Braun

## 2021-03-26 ENCOUNTER — Ambulatory Visit: Payer: Federal, State, Local not specified - PPO | Admitting: Family Medicine

## 2021-03-26 ENCOUNTER — Other Ambulatory Visit: Payer: Self-pay

## 2021-03-26 ENCOUNTER — Encounter: Payer: Self-pay | Admitting: Family Medicine

## 2021-03-26 VITALS — BP 133/88 | HR 108 | Temp 96.3°F | Wt 205.6 lb

## 2021-03-26 DIAGNOSIS — K5792 Diverticulitis of intestine, part unspecified, without perforation or abscess without bleeding: Secondary | ICD-10-CM | POA: Diagnosis not present

## 2021-03-26 NOTE — Patient Instructions (Signed)
Diverticulitis  Diverticulitis is when small pouches in your colon (large intestine) get infected or swollen. This causes pain in the belly (abdomen) and watery poop (diarrhea). These pouches are called diverticula. The pouches form in people who have a condition called diverticulosis. What are the causes? This condition may be caused by poop (stool) that gets trapped in the pouches in your colon. The poop lets germs (bacteria) grow in the pouches. This causes the infection. What increases the risk? You are more likely to get this condition if you have small pouches in your colon. The risk is higher if:  You are overweight or very overweight (obese).  You do not exercise enough.  You drink alcohol.  You smoke or use products with tobacco in them.  You eat a diet that has a lot of red meat such as beef, pork, or lamb.  You eat a diet that does not have enough fiber in it.  You are older than 40 years of age. What are the signs or symptoms?  Pain in the belly. Pain is often on the left side, but it may be in other areas.  Fever and feeling cold.  Feeling like you may vomit.  Vomiting.  Having cramps.  Feeling full.  Changes to how often you poop.  Blood in your poop. How is this treated? Most cases are treated at home by:  Taking over-the-counter pain medicines.  Following a clear liquid diet.  Taking antibiotic medicines.  Resting. Very bad cases may need to be treated at a hospital. This may include:  Not eating or drinking.  Taking prescription pain medicine.  Getting antibiotic medicines through an IV tube.  Getting fluid and food through an IV tube.  Having surgery. When you are feeling better, your doctor may tell you to have a test to check your colon (colonoscopy). Follow these instructions at home: Medicines  Take over-the-counter and prescription medicines only as told by your doctor. These include: ? Antibiotics. ? Pain medicines. ? Fiber  pills. ? Probiotics. ? Stool softeners.  If you were prescribed an antibiotic medicine, take it as told by your doctor. Do not stop taking the antibiotic even if you start to feel better.  Ask your doctor if the medicine prescribed to you requires you to avoid driving or using machinery. Eating and drinking  Follow a diet as told by your doctor.  When you feel better, your doctor may tell you to change your diet. You may need to eat a lot of fiber. Fiber makes it easier to poop (have a bowel movement). Foods with fiber include: ? Berries. ? Beans. ? Lentils. ? Green vegetables.  Avoid eating red meat.   General instructions  Do not use any products that contain nicotine or tobacco, such as cigarettes, e-cigarettes, and chewing tobacco. If you need help quitting, ask your doctor.  Exercise 3 or more times a week. Try to get 30 minutes each time. Exercise enough to sweat and make your heart beat faster.  Keep all follow-up visits as told by your doctor. This is important. Contact a doctor if:  Your pain does not get better.  You are not pooping like normal. Get help right away if:  Your pain gets worse.  Your symptoms do not get better.  Your symptoms get worse very fast.  You have a fever.  You vomit more than one time.  You have poop that is: ? Bloody. ? Black. ? Tarry. Summary  This condition happens when   small pouches in your colon get infected or swollen.  Take medicines only as told by your doctor.  Follow a diet as told by your doctor.  Keep all follow-up visits as told by your doctor. This is important. This information is not intended to replace advice given to you by your health care provider. Make sure you discuss any questions you have with your health care provider. Document Revised: 09/27/2019 Document Reviewed: 09/27/2019 Elsevier Patient Education  2021 [employer]vier Inc.  

## 2021-03-26 NOTE — Progress Notes (Signed)
Pt here for follow up on left side pain. Pt states she had to stop Levaquin due to joint aches and hand/feet tingling. Pt states she is having some pain under ribs but believes it is due to acid reflux(has not been taking meds). Pt states left side pain comes and goes.     Patient ID: Kristina Lambert, female    DOB: 04-18-1981, 40 y.o.   MRN: [id number]   Chief Complaint  Patient presents with  . Diverticulitis   Subjective:  CC: follow-up for diverticulitis  This is not a new problem.  Presents today for follow-up for diverticulitis.  Initially seen on March 21, with abdominal pain, CT scan ordered, showed acute uncomplicated diverticulitis.  Antibiotics started later that day.  Reports that she developed diarrhea, Cipro changed to levofloxacin, diarrhea continued, she stopped taking antibiotics after about 2 days of therapy.  Reports that the abdominal pain is much improved, reports that it is about a 1-2/10 currently.  Has been on a liquid diet since symptoms started.  Denies fever, chills, rigors, increasing abdominal pain.    Medical History Kristina Lambert has a past medical history of Anemia, GERD (gastroesophageal reflux disease), Heart palpitations, History of cholecystectomy (12/11/2018), Hypothyroidism (08/10/2018), and Irregular periods/menstrual cycles (08/10/2018).   Outpatient Encounter Medications as of 03/26/2021  Medication Sig  . Ascorbic Acid (VITAMIN C) 1000 MG tablet Take 1,000 mg by mouth daily.  . chlorzoxazone (PARAFON FORTE DSC) 500 MG tablet Take 1 tablet (500 mg total) by mouth 3 (three) times daily as needed for muscle spasms.  . Cholecalciferol (VITAMIN D3 PO) Take by mouth.  . co-enzyme Q-10 30 MG capsule Take 100 mg by mouth 3 (three) times daily.  . cyanocobalamin 1000 MCG tablet Take 1,000 mcg by mouth daily.  . famotidine (PEPCID) 20 MG tablet Take one tablet by mouth at bedtime  . Ferrous Fumarate (IRON) 18 MG TBCR Take by mouth.  . pantoprazole (PROTONIX) 40 MG  tablet Take 1 tablet (40 mg total) by mouth daily.  Marland Kitchen triamcinolone cream (KENALOG) 0.1 % Apply 1 application topically 2 (two) times daily. Apply to rash in rectal area for 1 wk.  . vitamin k 100 MCG tablet Take 100 mcg by mouth daily.  . [DISCONTINUED] levofloxacin (LEVAQUIN) 500 MG tablet Take 1 tablet (500 mg total) by mouth daily.  . [DISCONTINUED] metroNIDAZOLE (FLAGYL) 500 MG tablet Take 1 tablet (500 mg total) by mouth 3 (three) times daily.   Facility-Administered Encounter Medications as of 03/26/2021  Medication  . 0.9 %  sodium chloride infusion     Review of Systems  Constitutional: Negative for chills and fever.  Respiratory: Negative for shortness of breath.   Cardiovascular: Negative for chest pain.       Having acid reflux issues currently (went off of medicine).   Gastrointestinal: Positive for abdominal pain.       Left lower quadrant pain has improved. This current pain is acid reflux pain.      Vitals BP 133/88   Pulse (!) 108   Temp (!) 96.3 F (35.7 C)   Wt 205 lb 9.6 oz (93.3 kg)   SpO2 99%   BMI 32.69 kg/m   Objective:   Physical Exam Vitals reviewed.  Constitutional:      Appearance: Normal appearance.  Cardiovascular:     Rate and Rhythm: Normal rate and regular rhythm.     Heart sounds: Normal heart sounds.  Pulmonary:     Effort: Pulmonary effort is normal.  Breath sounds: Normal breath sounds.  Abdominal:     General: Bowel sounds are normal.     Tenderness: There is abdominal tenderness in the left lower quadrant.     Comments: 1-2/10 tenderness in LLQ (improved much).   Skin:    General: Skin is warm and dry.  Neurological:     General: No focal deficit present.     Mental Status: She is alert.  Psychiatric:        Behavior: Behavior normal.      Assessment and Plan   1. Diverticulitis   Stopped taking antibiotics after about 2 days due to diarrhea. Pain is much improved. Will restart acid reflux medication today.  We  will continue with a bland diet, adding foods slowly to see how she tolerates.  If pain worsens with advancing of diet, will back off.   ` Agrees with plan of care discussed today. Understands warning signs to seek further care: chest pain, shortness of breath, any significant change in health, fever, chills, increasing abdominal pain.  Understands to follow-up if symptoms worsen, consider ED for IV therapy, unable to tolerate oral antibiotics.    Novella Olive, NP 03/26/2021

## 2021-03-28 ENCOUNTER — Other Ambulatory Visit: Payer: Self-pay | Admitting: *Deleted

## 2021-03-28 ENCOUNTER — Telehealth: Payer: Self-pay | Admitting: Family Medicine

## 2021-03-28 DIAGNOSIS — K5792 Diverticulitis of intestine, part unspecified, without perforation or abscess without bleeding: Secondary | ICD-10-CM

## 2021-03-28 MED ORDER — SUCRALFATE 1 G PO TABS: ORAL_TABLET | ORAL | 1 refills | Status: DC

## 2021-03-28 NOTE — Telephone Encounter (Signed)
Pt.notified

## 2021-03-28 NOTE — Telephone Encounter (Signed)
Patient is requesting something for reflux to be called into [facility] on [address] in [place]

## 2021-03-28 NOTE — Telephone Encounter (Signed)
Saw Kristina Lambert on 3/28 for diverticulitis.pt states she stopped taking protonix due to diverticulitis and just started back taking it yesterday. States she called to get appt with dr Lorin Picket and was told he is booked out til may. She would like to see GI Dr. Elliot Dally at [facility] since she cant get in to see dr scott. States pain in left upper quad is getting better but having pain under both ribs and has a hernia that she thinks h=is causing the pain states it is a stinging pain.

## 2021-03-28 NOTE — Telephone Encounter (Signed)
So at this point I think just sticking with Protonix till she sees GI as well as Tums on a as needed basis

## 2021-03-28 NOTE — Telephone Encounter (Signed)
Pt states that the Carafate makes her middle back have pain. Pt states the pain feels like a backache. Please advise. Thank you

## 2021-03-28 NOTE — Telephone Encounter (Signed)
Discussed with pt. Pt is not having any bloody stools or vomiting. Referral put in and carafate added to allergy list

## 2021-03-28 NOTE — Telephone Encounter (Signed)
Referral to Dr. Elliot Dally as requested May also add Carafate 1 g, 1 tablet taken 3 times daily as needed #60 with 1 refill Continue Protonix  May be given an 1130 slot to see myself within the next few weeks if she desires Not April 6 because of meetings

## 2021-03-28 NOTE — Telephone Encounter (Signed)
So #1 please add to her epic med list as far as allergies that Carafate causes back pain-I have never had that happen with the patient but we will take her word for it Cancel Carafate Does she feel like the Protonix is helping?  Is she taking it on a regular basis? She can supplement with Tums Definitely go ahead with GI referral Any vomiting?  Bloody vomitus?  Bloody stools?

## 2021-04-16 ENCOUNTER — Ambulatory Visit: Payer: Federal, State, Local not specified - PPO | Admitting: Family Medicine

## 2021-04-16 ENCOUNTER — Other Ambulatory Visit: Payer: Self-pay

## 2021-04-16 ENCOUNTER — Encounter: Payer: Self-pay | Admitting: Family Medicine

## 2021-04-16 VITALS — BP 129/86 | HR 109 | Temp 97.5°F | Ht 66.5 in | Wt 197.0 lb

## 2021-04-16 DIAGNOSIS — K219 Gastro-esophageal reflux disease without esophagitis: Secondary | ICD-10-CM

## 2021-04-16 DIAGNOSIS — K449 Diaphragmatic hernia without obstruction or gangrene: Secondary | ICD-10-CM

## 2021-04-16 NOTE — Progress Notes (Signed)
   Subjective:    Patient ID: Kristina Lambert, female    DOB: [birth year], 40 y.o.   MRN: [id number]  HPI Patient is here to follow up rib pain possibly associated with hiatal hernia per patient Please see previous notes regarding the diverticulitis also patient states intermittent epigastric soreness with certain movements denies any projectile vomiting denies rectal bleeding Taking pantoprazole    Review of Systems  Constitutional: Negative for activity change and appetite change.  HENT: Negative for congestion and rhinorrhea.   Respiratory: Negative for cough and shortness of breath.   Cardiovascular: Negative for chest pain and leg swelling.  Gastrointestinal: Negative for abdominal pain, nausea and vomiting.  Skin: Negative for color change.  Neurological: Negative for dizziness and weakness.  Psychiatric/Behavioral: Negative for agitation and confusion.       Objective:   Physical Exam  Lungs clear heart regular pulse normal extremities no edema skin warm dry abdomen soft no guarding rebound or tenderness  Patient did talk with her gynecologist about the CAT scan of the head and he stated that her recent Pap smear looked good and those findings did not worry him    Assessment & Plan:  Some reflux related issues Some hiatal hernia related issues Diverticulitis has resolved Continue current measures May resume her iron tablets and supplemental vitamins Follow-up here as needed Do her lab work with her gynecologist in August send [place] copies of this Patient states she saw thin amount of white slime with her bowel movement she will watch this if it gets worse she will let us know if it persist she will let us know

## 2021-04-16 NOTE — Patient Instructions (Addendum)
Use ph[facility]ips colon health probiotic daily over the next few weeks  Send me a mychart message in 3 to 4 weeks thanks

## 2021-04-18 NOTE — Addendum Note (Signed)
Encounter addended by: Novella Olive on: 04/18/2021 7:23 PM  Actions taken: Letter saved

## 2021-04-26 DIAGNOSIS — R109 Unspecified abdominal pain: Secondary | ICD-10-CM | POA: Diagnosis not present

## 2021-04-26 DIAGNOSIS — K5792 Diverticulitis of intestine, part unspecified, without perforation or abscess without bleeding: Secondary | ICD-10-CM | POA: Diagnosis not present

## 2021-04-26 DIAGNOSIS — R519 Headache, unspecified: Secondary | ICD-10-CM | POA: Diagnosis not present

## 2021-04-26 DIAGNOSIS — R5383 Other fatigue: Secondary | ICD-10-CM | POA: Diagnosis not present

## 2021-05-14 ENCOUNTER — Encounter: Payer: Self-pay | Admitting: Family Medicine

## 2021-05-14 ENCOUNTER — Ambulatory Visit (INDEPENDENT_AMBULATORY_CARE_PROVIDER_SITE_OTHER): Payer: Federal, State, Local not specified - PPO | Admitting: Family Medicine

## 2021-05-14 ENCOUNTER — Other Ambulatory Visit: Payer: Self-pay

## 2021-05-14 VITALS — BP 124/68 | HR 117 | Temp 97.2°F | Wt 189.4 lb

## 2021-05-14 DIAGNOSIS — Z1159 Encounter for screening for other viral diseases: Secondary | ICD-10-CM

## 2021-05-14 DIAGNOSIS — D509 Iron deficiency anemia, unspecified: Secondary | ICD-10-CM | POA: Diagnosis not present

## 2021-05-14 DIAGNOSIS — R5383 Other fatigue: Secondary | ICD-10-CM

## 2021-05-14 DIAGNOSIS — Z114 Encounter for screening for human immunodeficiency virus [HIV]: Secondary | ICD-10-CM | POA: Diagnosis not present

## 2021-05-14 NOTE — Progress Notes (Signed)
   Subjective:    Patient ID: Kristina Lambert, female    DOB: 09-27-1981, 40 y.o.   MRN: [id number]  HPI Pt here for follow up on GI problems. Pt had diverticulitis and then a hernia. Pt states she is getting better. Pt has started probiotic. Pt is wanting to know if she can begin to take her iron again because she is feeling fatigued.   Mild amount of GI symptoms are doing much better her diverticulitis pain is gone away reflux symptoms are doing much better with the medicine  Does relate a moderate amount of fatigue and tiredness that she attributes mainly to iron deficiency Review of Systems     Objective:   Physical Exam Lungs clear heart regular pulse normal abdomen soft no guarding or tenderness       Assessment & Plan:  Reflux improving continue Protonix watch diet closely  Fatigue tiredness potentially related to iron deficiency check lab work await results  Follow-up 6 months  Will mail a copy of the lab work to the patient  If problems follow-up sooner

## 2021-05-22 DIAGNOSIS — Z13220 Encounter for screening for lipoid disorders: Secondary | ICD-10-CM | POA: Diagnosis not present

## 2021-05-22 DIAGNOSIS — R5383 Other fatigue: Secondary | ICD-10-CM | POA: Diagnosis not present

## 2021-05-22 DIAGNOSIS — Z114 Encounter for screening for human immunodeficiency virus [HIV]: Secondary | ICD-10-CM | POA: Diagnosis not present

## 2021-05-22 DIAGNOSIS — Z1159 Encounter for screening for other viral diseases: Secondary | ICD-10-CM | POA: Diagnosis not present

## 2021-05-22 DIAGNOSIS — D509 Iron deficiency anemia, unspecified: Secondary | ICD-10-CM | POA: Diagnosis not present

## 2021-05-23 LAB — CBC WITH DIFFERENTIAL/PLATELET
Basophils Absolute: 0 10*3/uL (ref 0.0–0.2)
Basos: 0 %
EOS (ABSOLUTE): 0 10*3/uL (ref 0.0–0.4)
Eos: 1 %
Hematocrit: 39.8 % (ref 34.0–46.6)
Hemoglobin: 12.7 g/dL (ref 11.1–15.9)
Immature Grans (Abs): 0 10*3/uL (ref 0.0–0.1)
Immature Granulocytes: 0 %
Lymphocytes Absolute: 0.8 10*3/uL (ref 0.7–3.1)
Lymphs: 20 %
MCH: 28.6 pg (ref 26.6–33.0)
MCHC: 31.9 g/dL (ref 31.5–35.7)
MCV: 90 fL (ref 79–97)
Monocytes Absolute: 0.3 10*3/uL (ref 0.1–0.9)
Monocytes: 8 %
Neutrophils Absolute: 2.7 10*3/uL (ref 1.4–7.0)
Neutrophils: 71 %
Platelets: 226 10*3/uL (ref 150–450)
RBC: 4.44 x10E6/uL (ref 3.77–5.28)
RDW: 12.6 % (ref 11.7–15.4)
WBC: 3.9 10*3/uL (ref 3.4–10.8)

## 2021-05-23 LAB — IRON AND TIBC
Iron Saturation: 11 % — ABNORMAL LOW (ref 15–55)
Iron: 40 ug/dL (ref 27–159)
Total Iron Binding Capacity: 356 ug/dL (ref 250–450)
UIBC: 316 ug/dL (ref 131–425)

## 2021-05-23 LAB — LIPID PANEL
Chol/HDL Ratio: 3.5 ratio (ref 0.0–4.4)
Cholesterol, Total: 192 mg/dL (ref 100–199)
HDL: 55 mg/dL (ref >39)
LDL Chol Calc (NIH): 121 mg/dL — ABNORMAL HIGH (ref 0–99)
Triglycerides: 89 mg/dL (ref 0–149)
VLDL Cholesterol Cal: 16 mg/dL (ref 5–40)

## 2021-05-23 LAB — FERRITIN: Ferritin: 19 ng/mL (ref 15–150)

## 2021-05-23 LAB — TSH: TSH: 1.75 u[IU]/mL (ref 0.450–4.500)

## 2021-05-23 LAB — HEPATITIS C ANTIBODY: Hep C Virus Ab: 0.1 s/co ratio (ref 0.0–0.9)

## 2021-05-23 LAB — HIV ANTIBODY (ROUTINE TESTING W REFLEX): HIV Screen 4th Generation wRfx: NONREACTIVE

## 2021-07-10 DIAGNOSIS — K5792 Diverticulitis of intestine, part unspecified, without perforation or abscess without bleeding: Secondary | ICD-10-CM | POA: Diagnosis not present

## 2021-07-10 DIAGNOSIS — R7989 Other specified abnormal findings of blood chemistry: Secondary | ICD-10-CM | POA: Diagnosis not present

## 2021-07-10 DIAGNOSIS — E611 Iron deficiency: Secondary | ICD-10-CM | POA: Diagnosis not present

## 2021-07-10 DIAGNOSIS — E559 Vitamin D deficiency, unspecified: Secondary | ICD-10-CM | POA: Diagnosis not present

## 2021-07-10 DIAGNOSIS — R109 Unspecified abdominal pain: Secondary | ICD-10-CM | POA: Diagnosis not present

## 2021-07-10 DIAGNOSIS — E538 Deficiency of other specified B group vitamins: Secondary | ICD-10-CM | POA: Diagnosis not present

## 2021-07-10 DIAGNOSIS — R5383 Other fatigue: Secondary | ICD-10-CM | POA: Diagnosis not present

## 2021-07-10 DIAGNOSIS — M2550 Pain in unspecified joint: Secondary | ICD-10-CM | POA: Diagnosis not present

## 2021-07-10 DIAGNOSIS — R519 Headache, unspecified: Secondary | ICD-10-CM | POA: Diagnosis not present

## 2021-08-03 DIAGNOSIS — R109 Unspecified abdominal pain: Secondary | ICD-10-CM | POA: Diagnosis not present

## 2021-08-03 DIAGNOSIS — R7989 Other specified abnormal findings of blood chemistry: Secondary | ICD-10-CM | POA: Diagnosis not present

## 2021-08-03 DIAGNOSIS — R5383 Other fatigue: Secondary | ICD-10-CM | POA: Diagnosis not present

## 2021-08-03 DIAGNOSIS — R519 Headache, unspecified: Secondary | ICD-10-CM | POA: Diagnosis not present

## 2021-10-25 DIAGNOSIS — Z8669 Personal history of other diseases of the nervous system and sense organs: Secondary | ICD-10-CM | POA: Diagnosis not present

## 2021-10-25 DIAGNOSIS — R7989 Other specified abnormal findings of blood chemistry: Secondary | ICD-10-CM | POA: Diagnosis not present

## 2021-10-25 DIAGNOSIS — L659 Nonscarring hair loss, unspecified: Secondary | ICD-10-CM | POA: Diagnosis not present

## 2021-10-25 DIAGNOSIS — R635 Abnormal weight gain: Secondary | ICD-10-CM | POA: Diagnosis not present

## 2021-10-25 DIAGNOSIS — R5383 Other fatigue: Secondary | ICD-10-CM | POA: Diagnosis not present

## 2021-10-25 DIAGNOSIS — E559 Vitamin D deficiency, unspecified: Secondary | ICD-10-CM | POA: Diagnosis not present

## 2021-10-25 DIAGNOSIS — M2550 Pain in unspecified joint: Secondary | ICD-10-CM | POA: Diagnosis not present

## 2021-10-25 DIAGNOSIS — E538 Deficiency of other specified B group vitamins: Secondary | ICD-10-CM | POA: Diagnosis not present

## 2021-11-02 DIAGNOSIS — K5792 Diverticulitis of intestine, part unspecified, without perforation or abscess without bleeding: Secondary | ICD-10-CM | POA: Diagnosis not present

## 2021-11-02 DIAGNOSIS — R5383 Other fatigue: Secondary | ICD-10-CM | POA: Diagnosis not present

## 2021-11-02 DIAGNOSIS — R7989 Other specified abnormal findings of blood chemistry: Secondary | ICD-10-CM | POA: Diagnosis not present

## 2021-11-02 DIAGNOSIS — R519 Headache, unspecified: Secondary | ICD-10-CM | POA: Diagnosis not present

## 2021-11-14 ENCOUNTER — Other Ambulatory Visit: Payer: Self-pay | Admitting: Family Medicine

## 2021-11-14 DIAGNOSIS — K219 Gastro-esophageal reflux disease without esophagitis: Secondary | ICD-10-CM

## 2021-11-14 NOTE — Telephone Encounter (Signed)
Patient has appointment 01/01/22 and needs protonix refilled before visit. Please advise.  [facility] on [address]  CB#:  [phone]

## 2021-11-28 ENCOUNTER — Emergency Department (HOSPITAL_BASED_OUTPATIENT_CLINIC_OR_DEPARTMENT_OTHER): Payer: Federal, State, Local not specified - PPO

## 2021-11-28 ENCOUNTER — Other Ambulatory Visit: Payer: Self-pay

## 2021-11-28 ENCOUNTER — Encounter (HOSPITAL_BASED_OUTPATIENT_CLINIC_OR_DEPARTMENT_OTHER): Payer: Self-pay

## 2021-11-28 ENCOUNTER — Emergency Department (HOSPITAL_BASED_OUTPATIENT_CLINIC_OR_DEPARTMENT_OTHER)
Admission: EM | Admit: 2021-11-28 | Discharge: 2021-11-28 | Disposition: A | Discharge status: Home / Self Care | Payer: Federal, State, Local not specified - PPO | Attending: Student | Admitting: Student

## 2021-11-28 DIAGNOSIS — R079 Chest pain, unspecified: Secondary | ICD-10-CM | POA: Diagnosis not present

## 2021-11-28 DIAGNOSIS — E039 Hypothyroidism, unspecified: Secondary | ICD-10-CM | POA: Diagnosis not present

## 2021-11-28 DIAGNOSIS — R0781 Pleurodynia: Secondary | ICD-10-CM | POA: Insufficient documentation

## 2021-11-28 DIAGNOSIS — R109 Unspecified abdominal pain: Secondary | ICD-10-CM | POA: Insufficient documentation

## 2021-11-28 DIAGNOSIS — R000 Tachycardia, unspecified: Secondary | ICD-10-CM | POA: Insufficient documentation

## 2021-11-28 DIAGNOSIS — R110 Nausea: Secondary | ICD-10-CM | POA: Diagnosis not present

## 2021-11-28 LAB — CBC
HCT: 38.8 % (ref 36.0–46.0)
Hemoglobin: 12.2 g/dL (ref 12.0–15.0)
MCH: 26.7 pg (ref 26.0–34.0)
MCHC: 31.4 g/dL (ref 30.0–36.0)
MCV: 84.9 fL (ref 80.0–100.0)
Platelets: 238 10*3/uL (ref 150–400)
RBC: 4.57 MIL/uL (ref 3.87–5.11)
RDW: 13.2 % (ref 11.5–15.5)
WBC: 6.7 10*3/uL (ref 4.0–10.5)
nRBC: 0 % (ref 0.0–0.2)

## 2021-11-28 LAB — BASIC METABOLIC PANEL
Anion gap: 12 (ref 5–15)
BUN: 6 mg/dL (ref 6–20)
CO2: 22 mmol/L (ref 22–32)
Calcium: 9.2 mg/dL (ref 8.9–10.3)
Chloride: 102 mmol/L (ref 98–111)
Creatinine, Ser: 0.63 mg/dL (ref 0.44–1.00)
GFR, Estimated: >60 mL/min (ref >60)
Glucose, Bld: 81 mg/dL (ref 70–99)
Potassium: 3.9 mmol/L (ref 3.5–5.1)
Sodium: 136 mmol/L (ref 135–145)

## 2021-11-28 LAB — D-DIMER, QUANTITATIVE: D-Dimer, Quant: 0.64 ug/mL-FEU — ABNORMAL HIGH (ref 0.00–0.50)

## 2021-11-28 LAB — TROPONIN I (HIGH SENSITIVITY)
Troponin I (High Sensitivity): 3 ng/L (ref <18)
Troponin I (High Sensitivity): 3 ng/L (ref <18)

## 2021-11-28 LAB — HEPATIC FUNCTION PANEL
ALT: 10 U/L (ref 0–44)
AST: 13 U/L — ABNORMAL LOW (ref 15–41)
Albumin: 4.5 g/dL (ref 3.5–5.0)
Alkaline Phosphatase: 52 U/L (ref 38–126)
Bilirubin, Direct: 0.1 mg/dL (ref 0.0–0.2)
Indirect Bilirubin: 0.6 mg/dL (ref 0.3–0.9)
Total Bilirubin: 0.7 mg/dL (ref 0.3–1.2)
Total Protein: 7.5 g/dL (ref 6.5–8.1)

## 2021-11-28 LAB — LIPASE, BLOOD: Lipase: 31 U/L (ref 11–51)

## 2021-11-28 LAB — PREGNANCY, URINE: Preg Test, Ur: NEGATIVE

## 2021-11-28 MED ORDER — MORPHINE SULFATE (PF) 4 MG/ML IV SOLN
4.0000 mg | Freq: Once | INTRAVENOUS | Status: DC
  Filled 2021-11-28: qty 1

## 2021-11-28 MED ORDER — IOHEXOL 350 MG/ML SOLN
100.0000 mL | Freq: Once | INTRAVENOUS | Status: AC | PRN
  Administered 2021-11-28: 100 mL via INTRAVENOUS

## 2021-11-28 NOTE — ED Notes (Signed)
Pt reports that she gets "chest tightness" when given morphine. EDP Margaux made aware. Pt reports that she can wait for pain medications until after CT scans.  Will reevaluate at that time.

## 2021-11-28 NOTE — ED Provider Notes (Signed)
[facility] EMERGENCY DEPARTMENT Provider Note   CSN: [id number] Arrival date & time: 11/28/21  1443     History Chief Complaint  Patient presents with   Rib pain    Kristina Lambert is a 40 y.o. female with PMHx GERD and hiatal hernia who presents to the ED today with complaint of gradual onset, constant, waxing and waning, rib pain x 3 days. Pt reports the pain varies from left to right lower ribs and intermittently shoots to her back. She also complains of nausea and dyspnea on exertion. She denies any recent trauma to the ribs. Denies hx DVT/PT. No recent prolonged travel or immobilization. No hemoptysis. No active malignancy. No exogenous hormone use. Pt states that this pain feels different than previous GERD. She has been compliant with her Protonix. She does mention hx of cholecystectomy 6 years ago and has had some intermittent abdominal pains since that time but has never been evaluated for same. No other complaints at this time.   The history is provided by the patient and medical records.      Past Medical History:  Diagnosis Date   Anemia    GERD (gastroesophageal reflux disease)    Heart palpitations    History of cholecystectomy 12/11/2018   2016-aspirin [facility]   Hypothyroidism 08/10/2018   Manages thyroid through [facility]- Dallie Piles NP   Irregular periods/menstrual cycles 08/10/2018   Managed by [facility] Also sees Dr Rosemary Holms on once a year basis    Patient Active Problem List   Diagnosis Date Noted   Left sided abdominal pain 03/19/2021   Pelvic pain 03/19/2021   Diverticulitis 03/19/2021   History of cholecystectomy 12/11/2018   Hypothyroidism 08/10/2018   Irregular periods/menstrual cycles 08/10/2018   Migraine with aura 09/15/2013   Esophageal reflux 08/20/2013    Past Surgical History:  Procedure Laterality Date   CHOLECYSTECTOMY     GALLBLADDER SURGERY  2016   MYRINGOTOMY WITH TUBE PLACEMENT     as a kid    WISDOM TOOTH EXTRACTION       OB History     Gravida  5   Para  3   Term  1   Preterm  2   AB  2   Living  3      SAB  2   IAB  0   Ectopic  0   Multiple  0   Live Births  2           Family History  Problem Relation Age of Onset   Diabetes Mother        doesn't take medication. Type 2    Heart disease Father    Anesthesia problems Neg Hx    Colon cancer Neg Hx    Esophageal cancer Neg Hx    Colon polyps Neg Hx    Stomach cancer Neg Hx    Rectal cancer Neg Hx     Social History   Tobacco Use   Smoking status: Never   Smokeless tobacco: Never  Vaping Use   Vaping Use: Never used  Substance Use Topics   Alcohol use: Not Currently   Drug use: No    Home Medications Prior to Admission medications   Medication Sig Start Date End Date Taking? Authorizing Provider  Ascorbic Acid (VITAMIN C) 1000 MG tablet Take 1,000 mg by mouth daily.    [provider]  Cholecalciferol (VITAMIN D3 PO) Take by mouth.    [provider]  co-enzyme Q-10 30 MG capsule Take 100 mg by mouth 3 (three) times daily.    [provider]  cyanocobalamin 1000 MCG tablet Take 1,000 mcg by mouth daily.    [provider]  famotidine (PEPCID) 20 MG tablet Take one tablet by mouth at bedtime Patient not taking: Reported on 05/14/2021 04/12/19   Babs Sciara, MD  Ferrous Fumarate (IRON) 18 MG TBCR Take by mouth. Patient not taking: Reported on 05/14/2021    [provider]  pantoprazole (PROTONIX) 40 MG tablet TAKE 1 TABLET BY MOUTH ONCE DAILY 11/14/21   Babs Sciara, MD  vitamin k 100 MCG tablet Take 100 mcg by mouth daily.    [provider]    Allergies    Levaquin [levofloxacin], Carafate [sucralfate], Influenza vaccines, Penicillins, Carrot flavor, Esomeprazole magnesium, and Sulfa antibiotics  Review of Systems   Review of Systems  Constitutional:  Negative for chills and fever.  Respiratory:  Positive for shortness  of breath. Negative for cough.   Cardiovascular:  Negative for chest pain, palpitations and leg swelling.       + bilateral rib pain  Gastrointestinal:  Positive for abdominal pain and nausea. Negative for constipation, diarrhea and vomiting.  All other systems reviewed and are negative.  Physical Exam Updated Vital Signs BP 122/80   Pulse 88   Temp 98.3 F (36.8 C) (Oral)   Resp 19   Ht 5\' 6"  (1.676 m)   Wt 68.9 kg   LMP 11/19/2021   SpO2 100%   BMI 24.53 kg/m   Physical Exam Vitals and nursing note reviewed.  Constitutional:      Appearance: She is not ill-appearing or diaphoretic.  HENT:     Head: Normocephalic and atraumatic.  Eyes:     Conjunctiva/sclera: Conjunctivae normal.  Cardiovascular:     Rate and Rhythm: Regular rhythm. Tachycardia present.     Pulses: Normal pulses.  Pulmonary:     Effort: Pulmonary effort is normal.     Breath sounds: Normal breath sounds. No wheezing, rhonchi or rales.     Comments: + bilateral lower rib TTP without crepitus. Equal and symmetric chest rise and fall. LCTAB.  Chest:     Chest wall: Tenderness present.  Abdominal:     Palpations: Abdomen is soft.     Tenderness: There is abdominal tenderness. There is no right CVA tenderness, left CVA tenderness, guarding or rebound.     Comments: Soft, RUQ/LUQ/and epigastric TTP, +BS throughout, no r/g/r, neg murphy's, neg mcburney's, no CVA TTP  Musculoskeletal:     Cervical back: Neck supple.     Right lower leg: No edema.     Left lower leg: No edema.  Skin:    General: Skin is warm and dry.  Neurological:     Mental Status: She is alert.    ED Results / Procedures / Treatments   Labs (all labs ordered are listed, but only abnormal results are displayed) Labs Reviewed  D-DIMER, QUANTITATIVE - Abnormal; Notable for the following components:      Result Value   D-Dimer, Quant 0.64 (*)    All other components within normal limits  HEPATIC FUNCTION PANEL - Abnormal; Notable for  the following components:   AST 13 (*)    All other components within normal limits  BASIC METABOLIC PANEL  CBC  PREGNANCY, URINE  LIPASE, BLOOD  TROPONIN I (HIGH SENSITIVITY)  TROPONIN I (HIGH SENSITIVITY)    EKG None  Radiology DG Chest 2  View  Result Date: 11/28/2021 CLINICAL DATA:  Bilateral rib pain EXAM: CHEST - 2 VIEW COMPARISON:  09/23/2020 FINDINGS: The heart size and mediastinal contours are within normal limits. Both lungs are clear. The visualized skeletal structures are unremarkable. IMPRESSION: No active cardiopulmonary disease. Electronically Signed   By: Jasmine Pang M.D.   On: 11/28/2021 15:36   CT Angio Chest PE W/Cm &/Or Wo Cm  Result Date: 11/28/2021 CLINICAL DATA:  Abdominal pain and concern for pulmonary embolism. EXAM: CT ANGIOGRAPHY CHEST CT ABDOMEN AND PELVIS WITH CONTRAST TECHNIQUE: Multidetector CT imaging of the chest was performed using the standard protocol during bolus administration of intravenous contrast. Multiplanar CT image reconstructions and MIPs were obtained to evaluate the vascular anatomy. Multidetector CT imaging of the abdomen and pelvis was performed using the standard protocol during bolus administration of intravenous contrast. CONTRAST:  OMNIPAQUE IOHEXOL 350 MG/ML SOLN COMPARISON:  Chest radiograph dated 11/28/2021. FINDINGS: CTA CHEST FINDINGS Cardiovascular: There is no cardiomegaly or pericardial effusion. The thoracic aorta is unremarkable. The origins of the great vessels of the aortic arch appear patent. No pulmonary artery embolus identified. Mediastinum/Nodes: There is no hilar or mediastinal adenopathy. The esophagus is grossly unremarkable. No mediastinal fluid collection. Lungs/Pleura: The lungs are clear. There is no pleural effusion pneumothorax. The central airways are patent. Musculoskeletal: No acute osseous pathology. Mild old compression fracture of superior endplate of T12. Review of the MIP images confirms the above  findings. CT ABDOMEN and PELVIS FINDINGS No intra-abdominal free air or free fluid. Hepatobiliary: Probable mild fatty liver. There is mild intrahepatic biliary dilatation, post cholecystectomy. Pancreas: Unremarkable. No pancreatic ductal dilatation or surrounding inflammatory changes. Spleen: Normal in size without focal abnormality. Adrenals/Urinary Tract: Adrenal glands are unremarkable. Kidneys are normal, without renal calculi, focal lesion, or hydronephrosis. Bladder is unremarkable. Stomach/Bowel: There is colonic diverticulosis without active inflammatory changes. There is no bowel obstruction or active inflammation. The appendix is normal. Vascular/Lymphatic: The abdominal aorta and IVC unremarkable. No portal venous gas. There is no adenopathy. Reproductive: The uterus is grossly unremarkable. No adnexal masses. Other: None Musculoskeletal: No acute or significant osseous findings. Review of the MIP images confirms the above findings. IMPRESSION: 1. No acute intrathoracic, abdominal, or pelvic pathology. No pulmonary artery embolus identified. 2. Colonic diverticulosis. No bowel obstruction. Normal appendix. Electronically Signed   By: Elgie Collard M.D.   On: 11/28/2021 18:54   CT Abdomen Pelvis W Contrast  Result Date: 11/28/2021 CLINICAL DATA:  Abdominal pain and concern for pulmonary embolism. EXAM: CT ANGIOGRAPHY CHEST CT ABDOMEN AND PELVIS WITH CONTRAST TECHNIQUE: Multidetector CT imaging of the chest was performed using the standard protocol during bolus administration of intravenous contrast. Multiplanar CT image reconstructions and MIPs were obtained to evaluate the vascular anatomy. Multidetector CT imaging of the abdomen and pelvis was performed using the standard protocol during bolus administration of intravenous contrast. CONTRAST:  OMNIPAQUE IOHEXOL 350 MG/ML SOLN COMPARISON:  Chest radiograph dated 11/28/2021. FINDINGS: CTA CHEST FINDINGS Cardiovascular: There is no  cardiomegaly or pericardial effusion. The thoracic aorta is unremarkable. The origins of the great vessels of the aortic arch appear patent. No pulmonary artery embolus identified. Mediastinum/Nodes: There is no hilar or mediastinal adenopathy. The esophagus is grossly unremarkable. No mediastinal fluid collection. Lungs/Pleura: The lungs are clear. There is no pleural effusion pneumothorax. The central airways are patent. Musculoskeletal: No acute osseous pathology. Mild old compression fracture of superior endplate of T12. Review of the MIP images confirms the above findings. CT ABDOMEN  and PELVIS FINDINGS No intra-abdominal free air or free fluid. Hepatobiliary: Probable mild fatty liver. There is mild intrahepatic biliary dilatation, post cholecystectomy. Pancreas: Unremarkable. No pancreatic ductal dilatation or surrounding inflammatory changes. Spleen: Normal in size without focal abnormality. Adrenals/Urinary Tract: Adrenal glands are unremarkable. Kidneys are normal, without renal calculi, focal lesion, or hydronephrosis. Bladder is unremarkable. Stomach/Bowel: There is colonic diverticulosis without active inflammatory changes. There is no bowel obstruction or active inflammation. The appendix is normal. Vascular/Lymphatic: The abdominal aorta and IVC unremarkable. No portal venous gas. There is no adenopathy. Reproductive: The uterus is grossly unremarkable. No adnexal masses. Other: None Musculoskeletal: No acute or significant osseous findings. Review of the MIP images confirms the above findings. IMPRESSION: 1. No acute intrathoracic, abdominal, or pelvic pathology. No pulmonary artery embolus identified. 2. Colonic diverticulosis. No bowel obstruction. Normal appendix. Electronically Signed   By: Elgie Collard M.D.   On: 11/28/2021 18:54    Procedures Procedures   Medications Ordered in ED Medications  iohexol (OMNIPAQUE) 350 MG/ML injection 100 mL (100 mLs Intravenous Contrast Given 11/28/21  1746)    ED Course  I have reviewed the triage vital signs and the nursing notes.  Pertinent labs & imaging results that were available during my care of the patient were reviewed by me and considered in my medical decision making (see chart for details).    MDM Rules/Calculators/A&P                           40 year old female who presents to the ED today with complaint of rib pain, varying in location as well as dyspnea on exertion.  On arrival to the ED today she is noted to be tachycardic in the 110s.  Chest x-ray clear.  Troponin 3.  CBC today and plan for any abnormalities.  No risk factors today concerning for PE however given tachycardia D-dimer elevated.  On my exam she is noted to have tenderness palpation along her bilateral lower ribs hemoglobin is return palpation along the right upper quadrant and upper gastric area.  Difficult to assess whether pain is related to GERD versus abdominal pain.  LFTs and lipase at this time.   D dimer 0.64.  We will proceed with CTA. Given questionable abdominal pain will add on CT A/P for further evaluation as well.   Repeat troponin unchanged at 3 CTA and CT A/P negative without acute findings at this time  Will discharge pt home today. Recommend Tylenol PRN for pain (she does not take NSAIDs s/2 hiatal hernia). Recommend following up with PCP for further eval. She is in agreement with plan and stable for discharge home.   This note was prepared using Dragon voice recognition software and may include unintentional dictation errors due to the inherent limitations of voice recognition software.   Final Clinical Impression(s) / ED Diagnoses Final diagnoses:  Rib pain    Rx / DC Orders ED Discharge Orders     None        Discharge Instructions      Your workup was overall reassuring in the ED today without acute findings   I would recommend continuing to take your protonix as prescribed. Take the Famotidine daily over the next few  days to see if this does not help with your pain. You can also take Tylenol as needed.   Follow up with your PCP for further evaluation of your symptoms  Return to the ED for any new/worsening  symptmos       Tanda Rockers, PA-C 11/28/21 1936    Glendora Score, MD 11/29/21 [phone]

## 2021-11-28 NOTE — ED Triage Notes (Addendum)
Pt c/o intermittent bilat rib pain x 3 days-denies injury-denies pain worse with movement-NAD-steady gait-later added she did have SOB when walking up stairs yesterday and pain radiates to back

## 2021-11-28 NOTE — Discharge Instructions (Signed)
Your workup was overall reassuring in the ED today without acute findings   I would recommend continuing to take your protonix as prescribed. Take the Famotidine daily over the next few days to see if this does not help with your pain. You can also take Tylenol as needed.   Follow up with your PCP for further evaluation of your symptoms  Return to the ED for any new/worsening symptmos

## 2021-11-28 NOTE — ED Notes (Signed)
ED Provider at bedside. 

## 2021-11-28 NOTE — ED Notes (Signed)
D/c paperwork reviewed with pt. Pt verbalized understanding, all questions addressed prior to d/c. Pt ambulatory to ED exit, accompanied by family.

## 2021-11-28 NOTE — ED Notes (Signed)
Lab notified to process d-dimer lab that was previously collected.

## 2021-12-03 ENCOUNTER — Ambulatory Visit: Payer: Federal, State, Local not specified - PPO | Admitting: Family Medicine

## 2021-12-03 ENCOUNTER — Other Ambulatory Visit: Payer: Self-pay

## 2021-12-03 VITALS — BP 122/82 | HR 97 | Ht 66.5 in | Wt 152.8 lb

## 2021-12-03 DIAGNOSIS — M7918 Myalgia, other site: Secondary | ICD-10-CM

## 2021-12-03 DIAGNOSIS — M79604 Pain in right leg: Secondary | ICD-10-CM | POA: Diagnosis not present

## 2021-12-03 DIAGNOSIS — R5383 Other fatigue: Secondary | ICD-10-CM | POA: Diagnosis not present

## 2021-12-03 DIAGNOSIS — K5792 Diverticulitis of intestine, part unspecified, without perforation or abscess without bleeding: Secondary | ICD-10-CM | POA: Diagnosis not present

## 2021-12-03 DIAGNOSIS — M79605 Pain in left leg: Secondary | ICD-10-CM | POA: Diagnosis not present

## 2021-12-03 MED ORDER — TRAMADOL HCL 50 MG PO TABS: 50.0000 mg | ORAL_TABLET | Freq: Two times a day (BID) | ORAL | 0 refills | Status: AC | PRN

## 2021-12-03 NOTE — Patient Instructions (Signed)
Medication as directed.  Follow up in 1 month with Dr. Lorin Picket

## 2021-12-03 NOTE — Assessment & Plan Note (Signed)
I suspect that the patient's pain is musculoskeletal in origin.  Laboratory studies as well as CT chest abdomen and pelvis was reviewed.  No concerning findings.  Patient's pain is migratory.  Possible relation to stress.  Discussed case with Dr. Gerda Diss.  He agrees with my recommendation for brief course of tramadol.  Follow-up in 1 month.

## 2021-12-03 NOTE — Progress Notes (Signed)
Subjective:  Patient ID: Kristina Lambert, female    DOB: Mar 28, 1981  Age: 40 y.o. MRN: [id number]  CC: Chief Complaint  Patient presents with   ER follow up    Rib pain and leg pain- multiple CT scans came back negative but still having leg pains- had elevated D dimer and was told to follow up with PCP    HPI:  40 year old female presents for evaluation of the above.  Patient states that she developed pain of her lower extremities and and subsequently of her lower ribs over the Thanksgiving holiday.  Subsequently went to the ER on 11/30.  She had a comprehensive work-up including laboratory studies as well as chest x-ray and then subsequently CT angio chest and CT abdomen and pelvis with contrast.  Laboratory studies as well as imaging was reviewed.  No significant abnormalities other than D-dimer.  PE was ruled out via CT angio.  Patient reports that her rib pain has improved but is still occurring intermittently.  She also reports pain of her lower extremities.  She states that it varies in location.  Comes and goes.  She states that the pain is tolerable but is quite troublesome.  She does not take any medication regarding her pain.  She states that a lot of medication causes GI symptoms.  Patient Active Problem List   Diagnosis Date Noted   Musculoskeletal pain 12/03/2021   Diverticulitis 03/19/2021   History of cholecystectomy 12/11/2018   Hypothyroidism 08/10/2018   Irregular periods/menstrual cycles 08/10/2018   Migraine with aura 09/15/2013   Esophageal reflux 08/20/2013    Social Hx   Social History   Socioeconomic History   Marital status: Married    Spouse name: Not on file   Number of children: 3   Years of education: Not on file   Highest education level: Not on file  Occupational History   Not on file  Tobacco Use   Smoking status: Never   Smokeless tobacco: Never  Vaping Use   Vaping Use: Never used  Substance and Sexual Activity   Alcohol use: Not  Currently   Drug use: No   Sexual activity: Yes    Birth control/protection: None  Other Topics Concern   Not on file  Social History Narrative   Not on file   Social Determinants of Health   Financial Resource Strain: Not on file  Food Insecurity: Not on file  Transportation Needs: Not on file  Physical Activity: Not on file  Stress: Not on file  Social Connections: Not on file    Review of Systems  Constitutional: Negative.   Musculoskeletal:        Rib pain, pain in the lower extremities.    Objective:  BP 122/82   Pulse 97   Ht 5' 6.5" (1.689 m)   Wt 152 lb 12.8 oz (69.3 kg)   LMP 11/19/2021 Comment: Negative pregnancy test in ED today  SpO2 97%   BMI 24.29 kg/m   BP/Weight 12/03/2021 11/28/2021 05/14/2021  Systolic BP 122 122 124  Diastolic BP 82 80 68  Wt. (Lbs) 152.8 152 189.4  BMI 24.29 24.53 30.11    Physical Exam Vitals and nursing note reviewed.  Constitutional:      General: She is not in acute distress.    Appearance: Normal appearance. She is not ill-appearing.  HENT:     Head: Normocephalic and atraumatic.  Eyes:     General:        Right eye:  No discharge.        Left eye: No discharge.     Conjunctiva/sclera: Conjunctivae normal.  Cardiovascular:     Rate and Rhythm: Normal rate and regular rhythm.  Pulmonary:     Effort: Pulmonary effort is normal.     Breath sounds: Normal breath sounds. No wheezing or rales.  Musculoskeletal:     Comments: Patient endorses pain of the lower ribs with palpation.  No appreciable abnormalities of the lower extremities.  Neurological:     Mental Status: She is alert.    Lab Results  Component Value Date   WBC 6.7 11/28/2021   HGB 12.2 11/28/2021   HCT 38.8 11/28/2021   PLT 238 11/28/2021   GLUCOSE 81 11/28/2021   CHOL 192 05/22/2021   TRIG 89 05/22/2021   HDL 55 05/22/2021   LDLCALC 121 (H) 05/22/2021   ALT 10 11/28/2021   AST 13 (L) 11/28/2021   NA 136 11/28/2021   K 3.9 11/28/2021   CL  102 11/28/2021   CREATININE 0.63 11/28/2021   BUN 6 11/28/2021   CO2 22 11/28/2021   TSH 1.750 05/22/2021     Assessment & Plan:   Problem List Items Addressed This Visit       Other   Musculoskeletal pain - Primary    I suspect that the patient's pain is musculoskeletal in origin.  Laboratory studies as well as CT chest abdomen and pelvis was reviewed.  No concerning findings.  Patient's pain is migratory.  Possible relation to stress.  Discussed case with Dr. Gerda Diss.  He agrees with my recommendation for brief course of tramadol.  Follow-up in 1 month.       Meds ordered this encounter  Medications   traMADol (ULTRAM) 50 MG tablet    Sig: Take 1 tablet (50 mg total) by mouth every 12 (twelve) hours as needed.    Dispense:  10 tablet    Refill:  0    Follow-up:  1 month   Exavier Lina DO [facility]

## 2022-01-01 ENCOUNTER — Other Ambulatory Visit: Payer: Self-pay

## 2022-01-01 ENCOUNTER — Ambulatory Visit: Payer: Federal, State, Local not specified - PPO | Admitting: Family Medicine

## 2022-01-01 VITALS — BP 115/79 | HR 77 | Temp 99.1°F | Ht 66.5 in | Wt 152.0 lb

## 2022-01-01 DIAGNOSIS — E785 Hyperlipidemia, unspecified: Secondary | ICD-10-CM | POA: Diagnosis not present

## 2022-01-01 DIAGNOSIS — K219 Gastro-esophageal reflux disease without esophagitis: Secondary | ICD-10-CM | POA: Diagnosis not present

## 2022-01-01 MED ORDER — PANTOPRAZOLE SODIUM 40 MG PO TBEC
40.0000 mg | DELAYED_RELEASE_TABLET | Freq: Every day | ORAL | 2 refills | Status: AC
Start: 2022-01-01 — End: ?

## 2022-01-01 MED ORDER — PANTOPRAZOLE SODIUM 40 MG PO TBEC: 40.0000 mg | DELAYED_RELEASE_TABLET | Freq: Every day | ORAL | 2 refills | Status: DC

## 2022-01-01 MED ORDER — FAMOTIDINE 20 MG PO TABS: ORAL_TABLET | ORAL | 1 refills | Status: AC

## 2022-01-01 NOTE — Progress Notes (Signed)
° °  Subjective:    Patient ID: Kristina Lambert, female    DOB: 1981/09/24, 41 y.o.   MRN: [id number]  HPI  Patient being seen for renewal prescription for Protonix  States medicine does her well without medicine has a lot of heartburn issues denies any fever chills sweats Recently went to the ER because of upper abdominal pain bilateral extensive work-up negative still gets some pain intermittently but nothing severe Review of Systems     Objective:   Physical Exam  General-in no acute distress Eyes-no discharge Lungs-respiratory rate normal, CTA CV-no murmurs,RRR Extremities skin warm dry no edema Neuro grossly normal Behavior normal, alert Abdomen soft no guarding rebound or tenderness      Assessment & Plan:  Currently trying to get information from pharmacy regarding if her PPI is covered if it is not why so?  Prior authorization?  Otherwise Protonix daily With hyperlipidemia history I recommend repeating lipid profile

## 2022-01-18 DIAGNOSIS — R519 Headache, unspecified: Secondary | ICD-10-CM | POA: Diagnosis not present

## 2022-01-18 DIAGNOSIS — Z1589 Genetic susceptibility to other disease: Secondary | ICD-10-CM | POA: Diagnosis not present

## 2022-01-18 DIAGNOSIS — E538 Deficiency of other specified B group vitamins: Secondary | ICD-10-CM | POA: Diagnosis not present

## 2022-01-18 DIAGNOSIS — E611 Iron deficiency: Secondary | ICD-10-CM | POA: Diagnosis not present

## 2022-01-18 DIAGNOSIS — M2550 Pain in unspecified joint: Secondary | ICD-10-CM | POA: Diagnosis not present

## 2022-01-18 DIAGNOSIS — R5383 Other fatigue: Secondary | ICD-10-CM | POA: Diagnosis not present

## 2022-01-18 DIAGNOSIS — K5792 Diverticulitis of intestine, part unspecified, without perforation or abscess without bleeding: Secondary | ICD-10-CM | POA: Diagnosis not present

## 2022-01-24 ENCOUNTER — Telehealth: Payer: Self-pay | Admitting: Family Medicine

## 2022-01-24 NOTE — Telephone Encounter (Signed)
Pt called and stated Dr Lorin Picket had told her that her acid reflux meds may not be covered, but if they were not to call back and he would write a ltr. [employer], meds not covered. She has an allergy to other acid reflux meds. Pharmacy is [facility] in [place], [place]. She stated Dr Lorin Picket said he knew what to do if it was denied.   [phone]

## 2022-01-24 NOTE — Telephone Encounter (Signed)
Nurses if possible could you find out more specifics regarding what other medicine is her insurance requires-it may be buried in recent notes.-In addition to this more than just a general statement from the patient which medications that she allergic to and what would she consider the symptoms of the allergy? If we do not have specifics the chance of an appeal is pretty slim and none.  There is still a possibility even with a detailed letter that it will get denied but tell the patient at least we can try thank you

## 2022-01-25 NOTE — Telephone Encounter (Signed)
Patient's insurance plan will only cover 90 pills of any PPI in a 365 day period per insurance

## 2022-01-27 ENCOUNTER — Encounter: Payer: Self-pay | Admitting: Family Medicine

## 2022-01-27 NOTE — Telephone Encounter (Signed)
1.  I will do a letter of appeal on 2.  It would be up to the patient to submit this to her insurance company 3.  Please also let the patient be aware that according to what was sent to [place] her insurance company will only cover 90 days PPI in a 1 year calendar.  Often in the situations even with a letter it will not be covered-essentially we do not have the power to override the rules of a person's insurance plan that they have with their insurance company and employer. (Certainly we are sympathetic but we cannot take responsibilities or create rules that supersede what the insurance company state)

## 2022-01-28 NOTE — Telephone Encounter (Signed)
Patient informed of md message and instructions. Verbalized understanding. Patient states she will have her mom come by and pick up the letter.

## 2022-02-04 DIAGNOSIS — R519 Headache, unspecified: Secondary | ICD-10-CM | POA: Diagnosis not present

## 2022-02-04 DIAGNOSIS — M79605 Pain in left leg: Secondary | ICD-10-CM | POA: Diagnosis not present

## 2022-02-04 DIAGNOSIS — R5383 Other fatigue: Secondary | ICD-10-CM | POA: Diagnosis not present

## 2022-02-04 DIAGNOSIS — M79604 Pain in right leg: Secondary | ICD-10-CM | POA: Diagnosis not present

## 2022-08-06 IMAGING — US US EXTREM LOW VENOUS
1 series · 13 of 24 positions shown · non-contrast
Comparison: None.

CLINICAL DATA: Elevated D-dimer



[Series 1: us venous img lower bilat (dvt) · portal-venous · 13 of 64 slices shown]
[im 1/64]
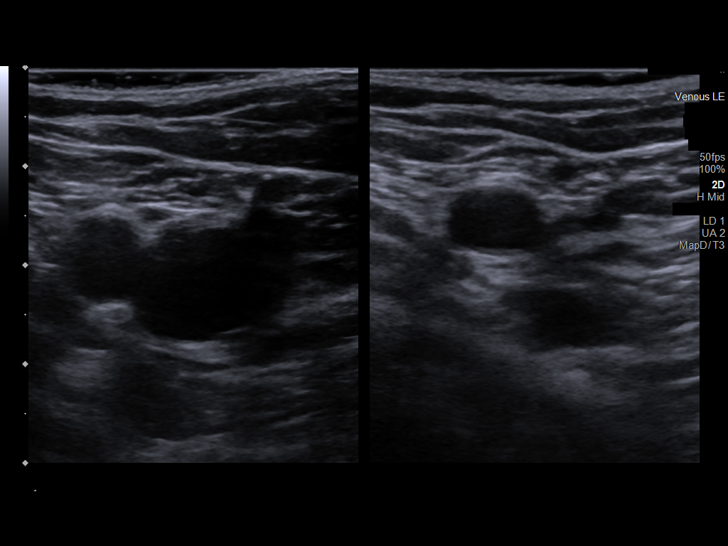
[im 6/64]
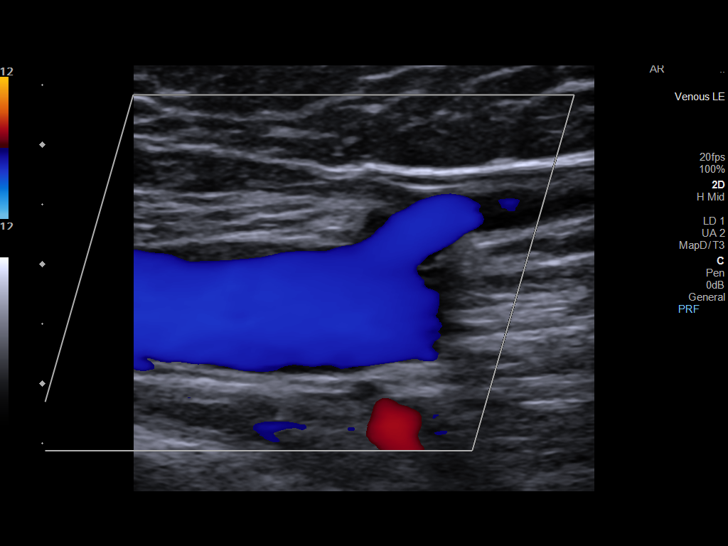
[im 11/64]
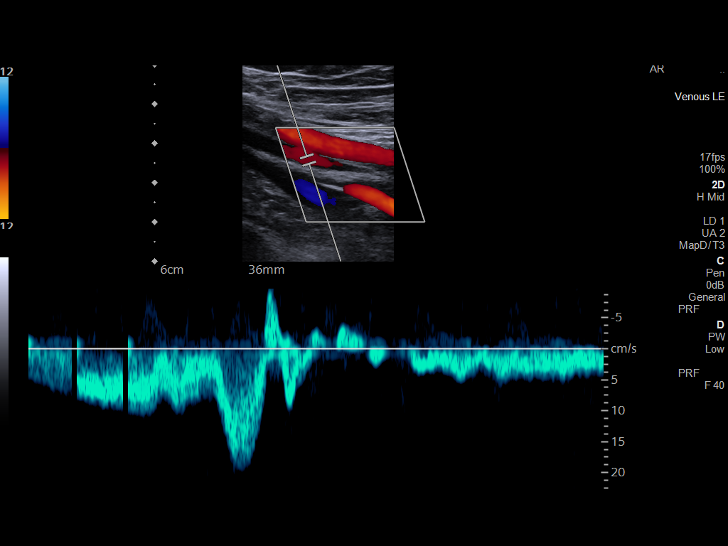
[im 17/64]
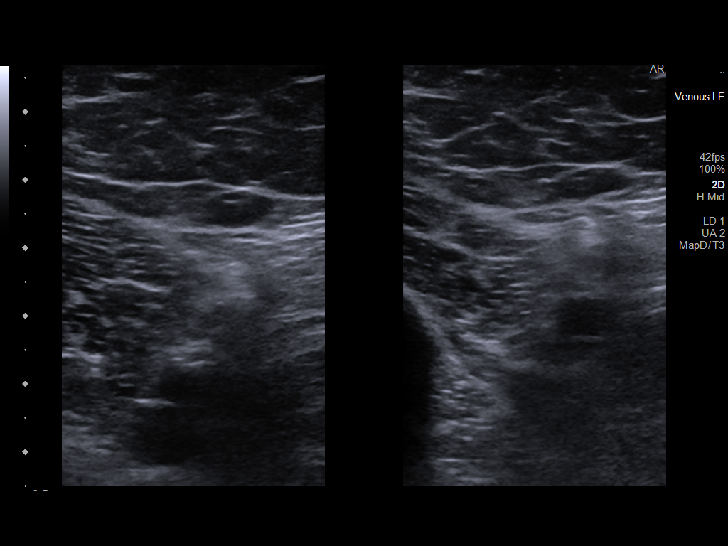
[im 22/64]
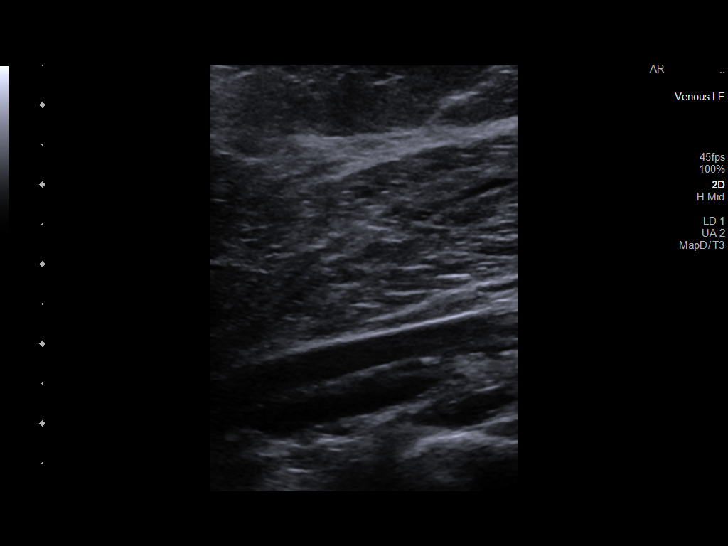
[im 28/64]
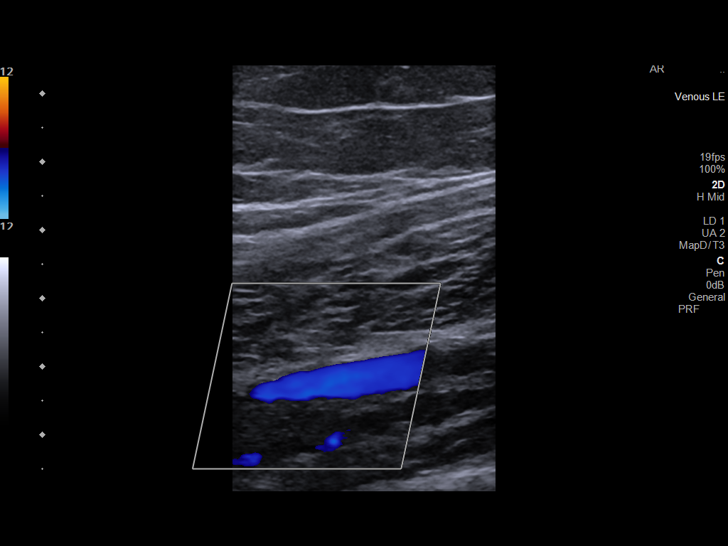
[im 33/64]
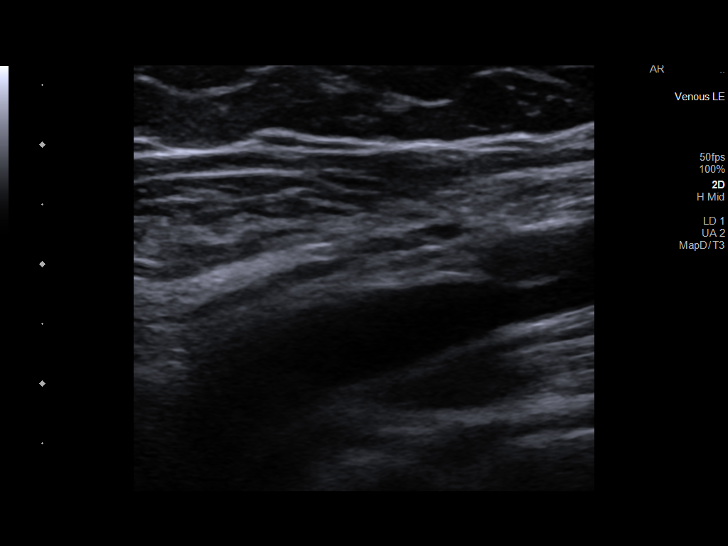
[im 36/64]
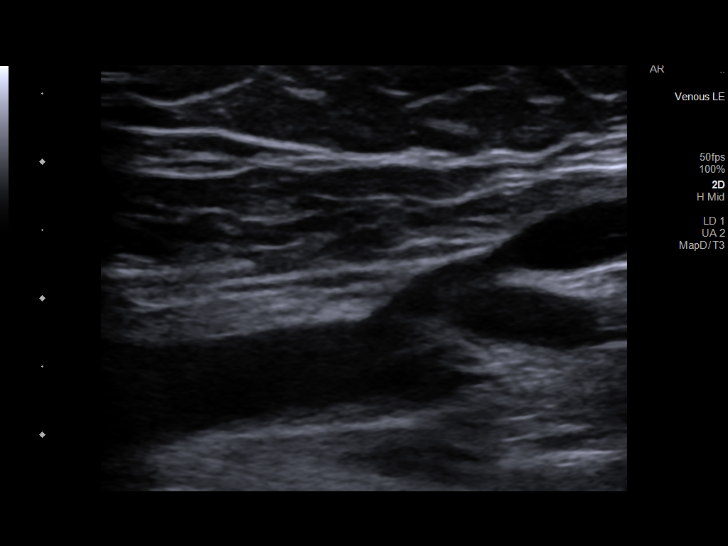
[im 42/64]
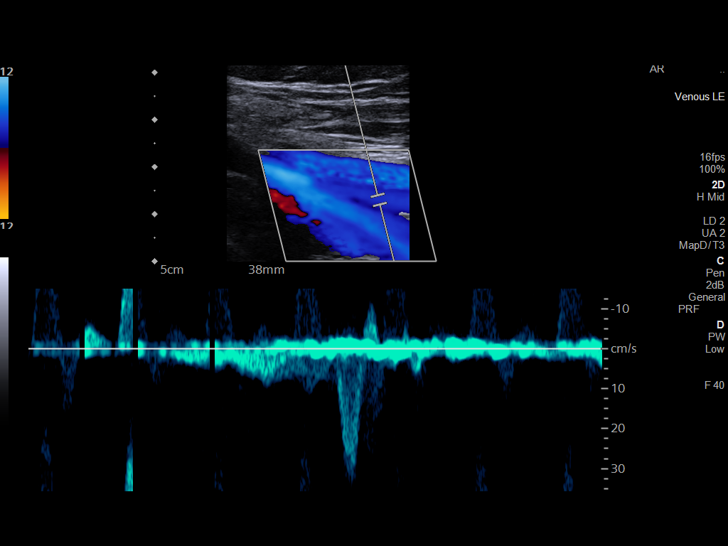
[im 47/64]
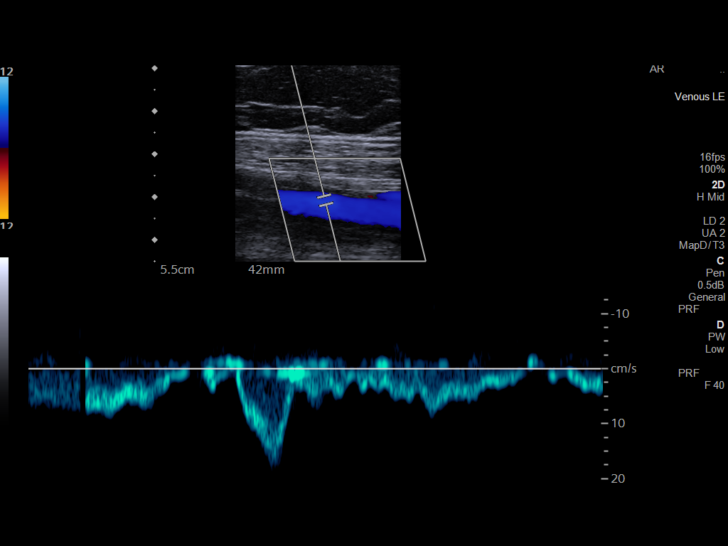
[im 53/64]
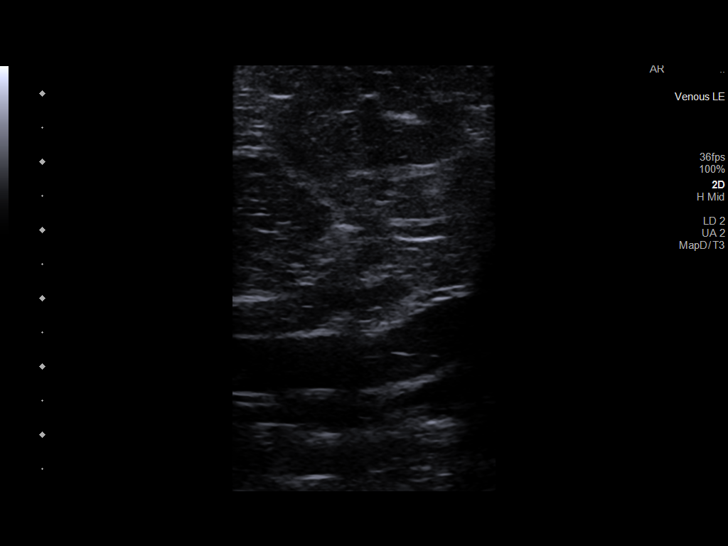
[im 58/64]
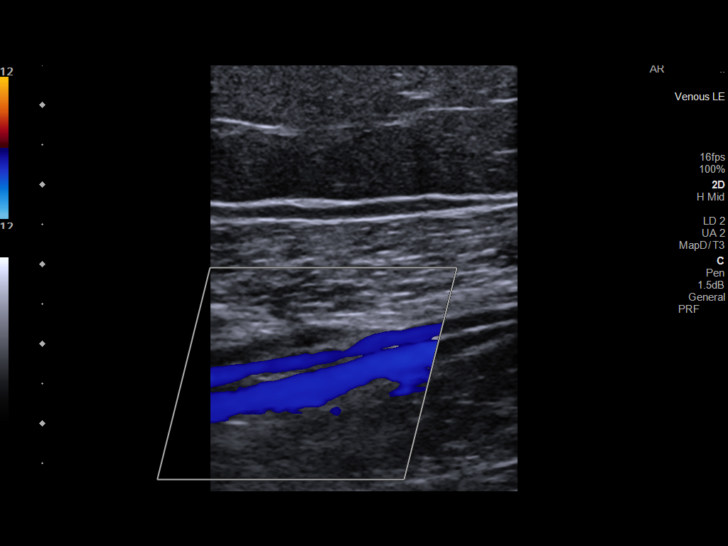
[im 64/64]
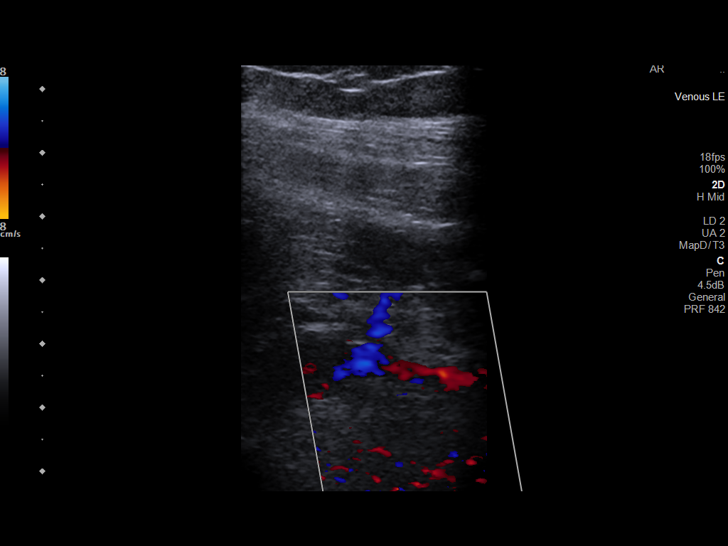

[13 of 24 positions shown; findings below may reference images not displayed]

FINDINGS: RIGHT LOWER EXTREMITY

Common Femoral Vein: No evidence of thrombus. Normal
compressibility, respiratory phasicity and response to augmentation.

Saphenofemoral Junction: No evidence of thrombus. Normal
compressibility and flow on color Doppler imaging.

Profunda Femoral Vein: No evidence of thrombus. Normal
compressibility and flow on color Doppler imaging.

Femoral Vein: No evidence of thrombus. Normal compressibility,
respiratory phasicity and response to augmentation.

Popliteal Vein: No evidence of thrombus. Normal compressibility,
respiratory phasicity and response to augmentation.

Calf Veins: No evidence of thrombus. Normal compressibility and flow
on color Doppler imaging.

Superficial Great Saphenous Vein: No evidence of thrombus. Normal
compressibility.

Venous Reflux:  None.

Other Findings:  None.

LEFT LOWER EXTREMITY

Common Femoral Vein: No evidence of thrombus. Normal
compressibility, respiratory phasicity and response to augmentation.

Saphenofemoral Junction: No evidence of thrombus. Normal
compressibility and flow on color Doppler imaging.

Profunda Femoral Vein: No evidence of thrombus. Normal
compressibility and flow on color Doppler imaging.

Femoral Vein: No evidence of thrombus. Normal compressibility,
respiratory phasicity and response to augmentation.

Popliteal Vein: No evidence of thrombus. Normal compressibility,
respiratory phasicity and response to augmentation.

Calf Veins: No evidence of thrombus. Normal compressibility and flow
on color Doppler imaging.

Superficial Great Saphenous Vein: No evidence of thrombus. Normal
compressibility.

Venous Reflux:  None.

Other Findings:  None.
IMPRESSION: No evidence of deep venous thrombosis in either lower extremity.

## 2023-10-07 IMAGING — CT CT ANGIO CHEST
2 of 10 series · 18 of 36 positions shown · IV contrast (Omnipaque)
Comparison: Chest radiograph dated 11/28/2021.

CLINICAL DATA: Abdominal pain and concern for pulmonary embolism.

EXAM:
CT ANGIOGRAPHY CHEST
CT ABDOMEN AND PELVIS WITH CONTRAST
TECHNIQUE: Multidetector CT imaging of the chest was performed using the
standard protocol during bolus administration of intravenous
contrast. Multiplanar CT image reconstructions and MIPs were
obtained to evaluate the vascular anatomy. Multidetector CT imaging
of the abdomen and pelvis was performed using the standard protocol
during bolus administration of intravenous contrast.
CONTRAST:  100mL OMNIPAQUE IOHEXOL 350 MG/ML SOLN

[Series 9: pe thins · axial · 0.75mm/px · z∈[+1103,+1384]mm · 17 of 317 slices shown]
[im 18/317  lung]
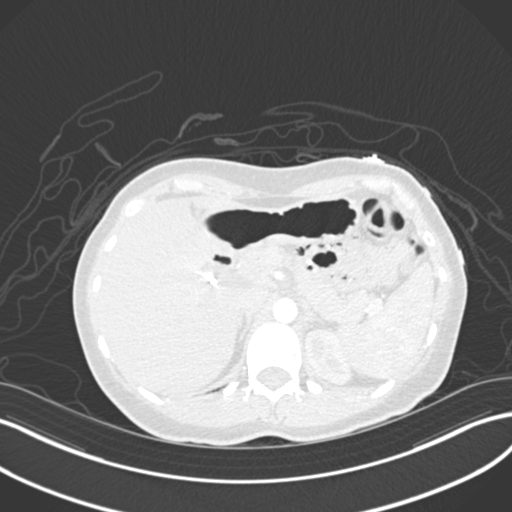
[im 36/317  mediastinal]
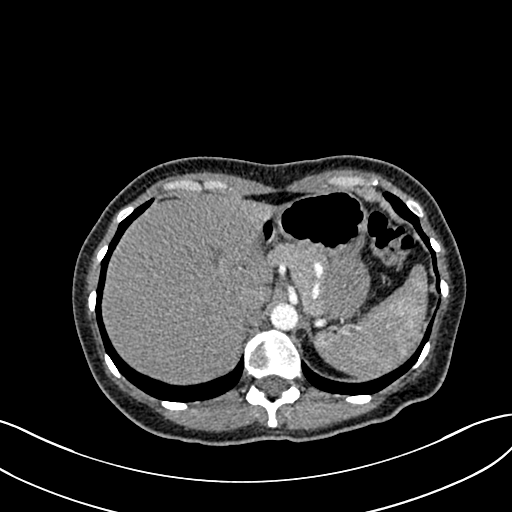
[im 53/317  lung]
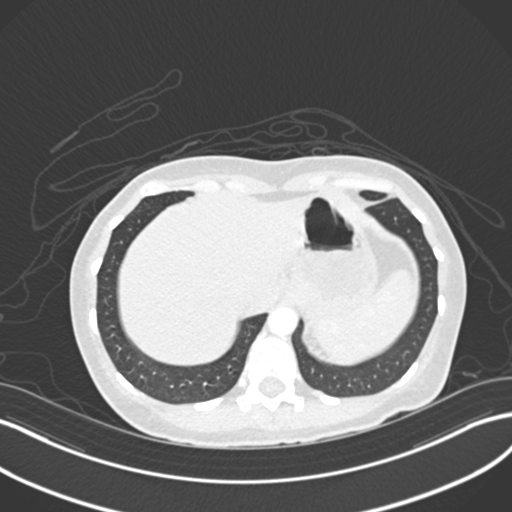
[im 71/317  mediastinal]
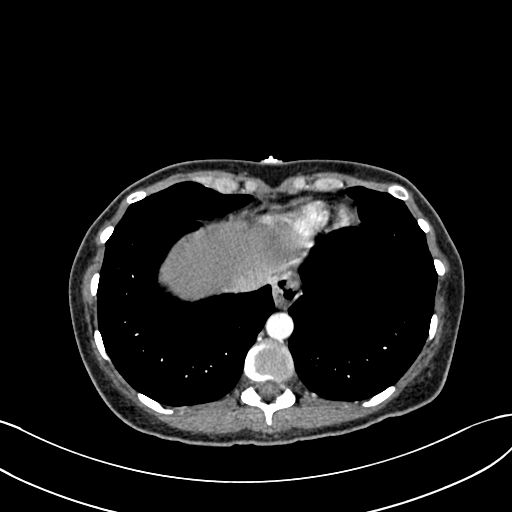
[im 88/317  lung]
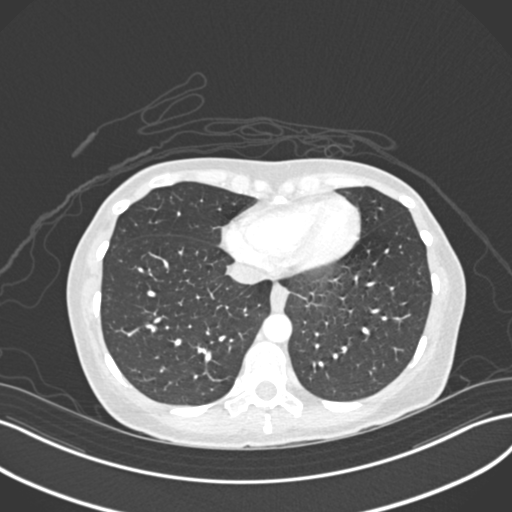
[im 106/317  mediastinal]
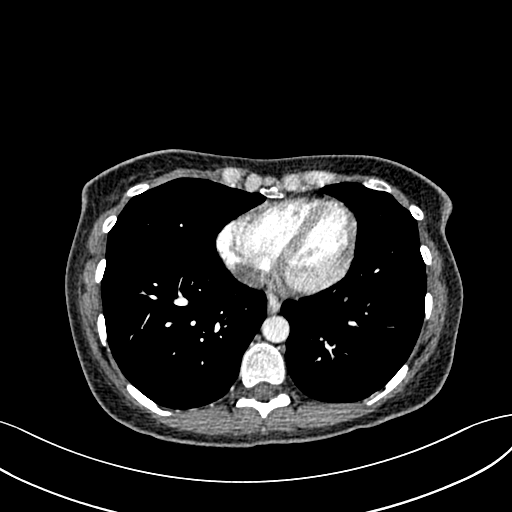
[im 123/317  lung]
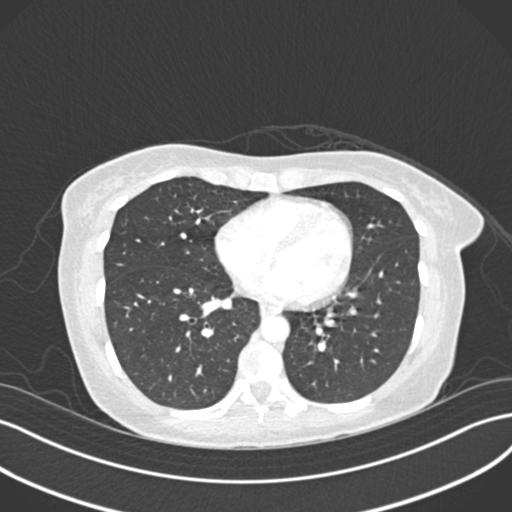
[im 141/317  mediastinal]
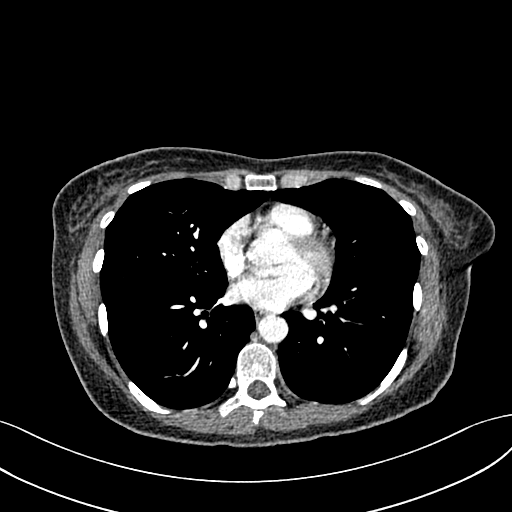
[im 159/317  lung]
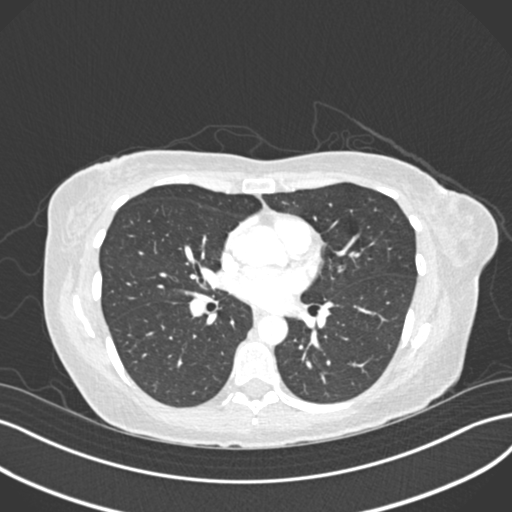
[im 176/317  mediastinal]
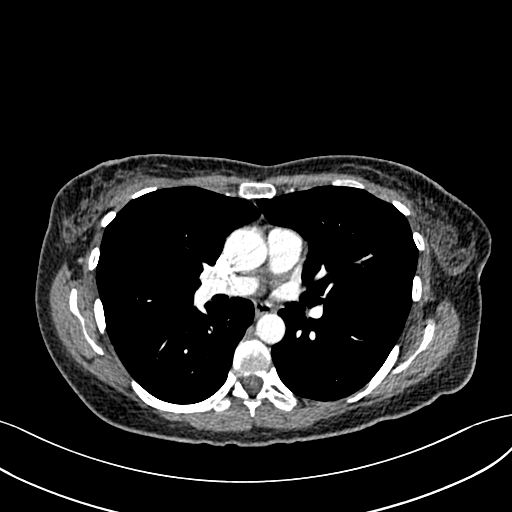
[im 194/317  lung]
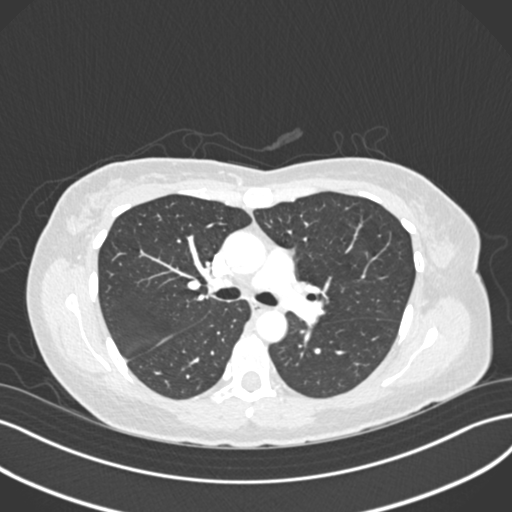
[im 211/317  mediastinal]
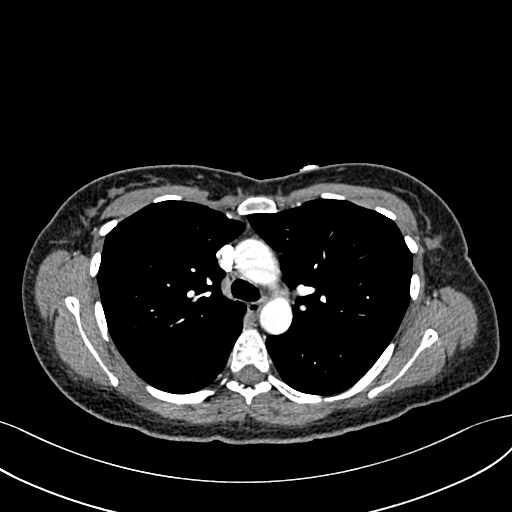
[im 229/317  lung]
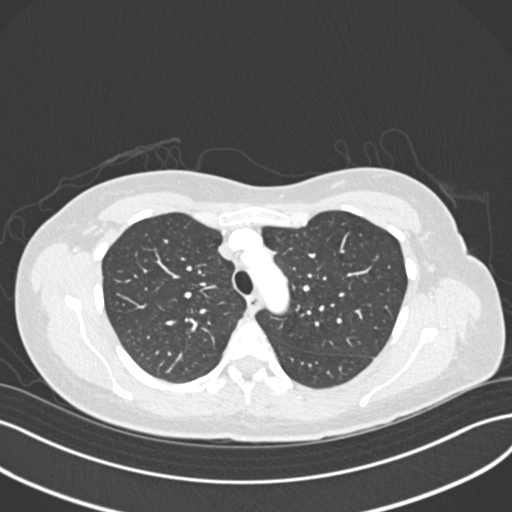
[im 246/317  mediastinal]
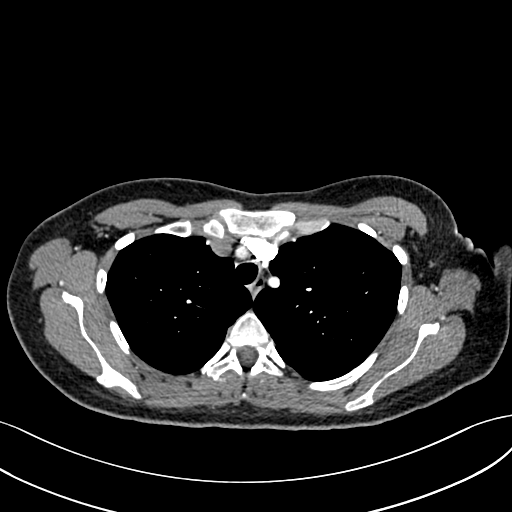
[im 264/317  lung]
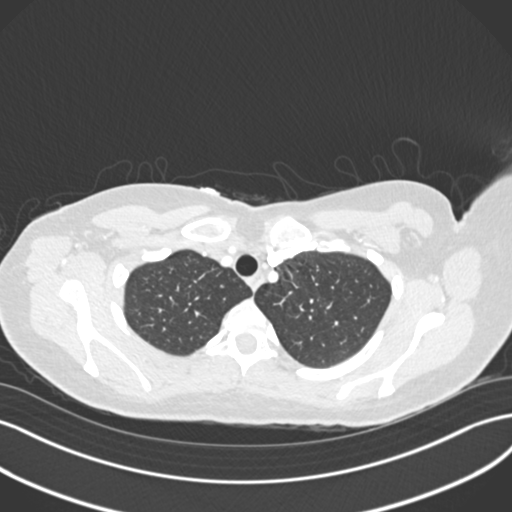
[im 281/317  mediastinal]
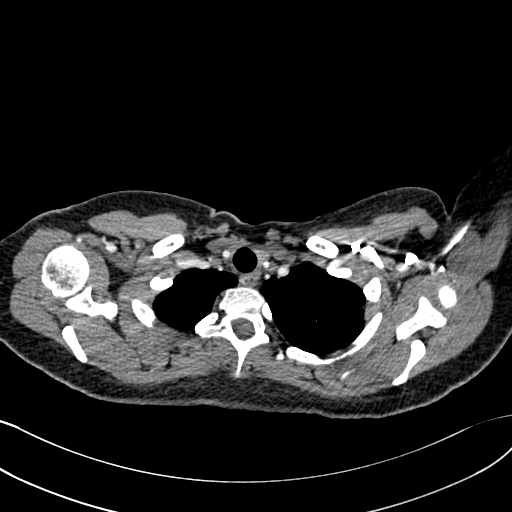
[im 299/317  lung]
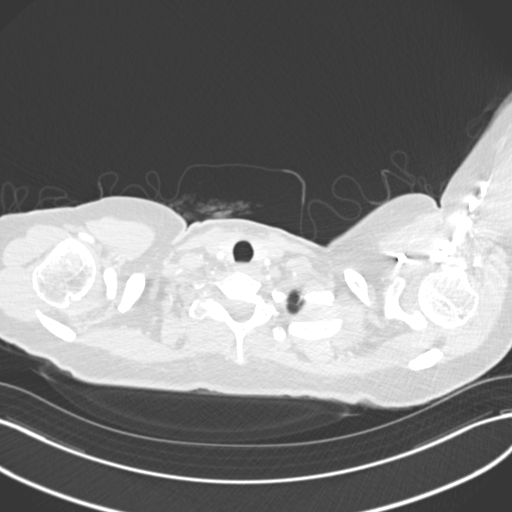

[Series 10: pe coronal mpr · coronal · 0.65mm/px · 1 of 149 slices shown]
[im 75/149  mediastinal]
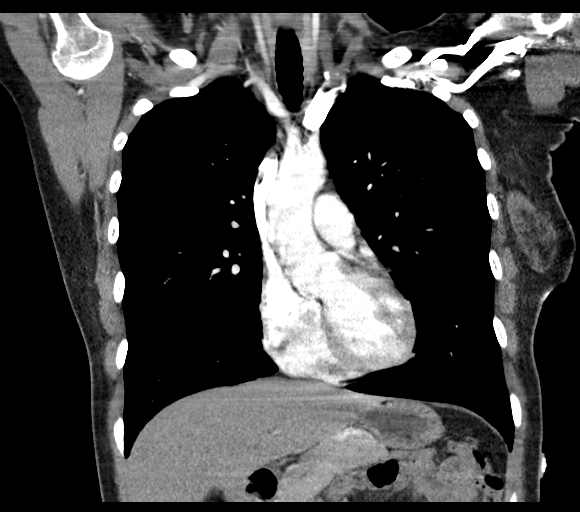

[18 of 36 positions shown; findings below may reference images not displayed]

FINDINGS: CTA CHEST FINDINGS

Cardiovascular: There is no cardiomegaly or pericardial effusion.
The thoracic aorta is unremarkable. The origins of the great vessels
of the aortic arch appear patent. No pulmonary artery embolus
identified.

Mediastinum/Nodes: There is no hilar or mediastinal adenopathy. The
esophagus is grossly unremarkable. No mediastinal fluid collection.

Lungs/Pleura: The lungs are clear. There is no pleural effusion
pneumothorax. The central airways are patent.

Musculoskeletal: No acute osseous pathology. Mild old compression
fracture of superior endplate of T12.

Review of the MIP images confirms the above findings.

CT ABDOMEN and PELVIS FINDINGS

No intra-abdominal free air or free fluid.

Hepatobiliary: Probable mild fatty liver. There is mild intrahepatic
biliary dilatation, post cholecystectomy.

Pancreas: Unremarkable. No pancreatic ductal dilatation or
surrounding inflammatory changes.

Spleen: Normal in size without focal abnormality.

Adrenals/Urinary Tract: Adrenal glands are unremarkable. Kidneys are
normal, without renal calculi, focal lesion, or hydronephrosis.
Bladder is unremarkable.

Stomach/Bowel: There is colonic diverticulosis without active
inflammatory changes. There is no bowel obstruction or active
inflammation. The appendix is normal.

Vascular/Lymphatic: The abdominal aorta and IVC unremarkable. No
portal venous gas. There is no adenopathy.

Reproductive: The uterus is grossly unremarkable. No adnexal masses.

Other: None

Musculoskeletal: No acute or significant osseous findings.

Review of the MIP images confirms the above findings.
IMPRESSION: 1. No acute intrathoracic, abdominal, or pelvic pathology. No
pulmonary artery embolus identified.
2. Colonic diverticulosis. No bowel obstruction. Normal appendix.
